# Patient Record
Sex: Female | Born: 1970 | Race: Black or African American | Hispanic: No | Smoking: Never smoker
Health system: Southern US, Community
[De-identification: ages and names within clinical notes are randomized; demographics above are authoritative.]

## PROBLEM LIST (undated history)

## (undated) DIAGNOSIS — K589 Irritable bowel syndrome without diarrhea: Secondary | ICD-10-CM

## (undated) DIAGNOSIS — J45909 Unspecified asthma, uncomplicated: Secondary | ICD-10-CM

## (undated) DIAGNOSIS — R002 Palpitations: Secondary | ICD-10-CM

## (undated) DIAGNOSIS — M2550 Pain in unspecified joint: Secondary | ICD-10-CM

## (undated) DIAGNOSIS — J302 Other seasonal allergic rhinitis: Secondary | ICD-10-CM

## (undated) DIAGNOSIS — E739 Lactose intolerance, unspecified: Secondary | ICD-10-CM

## (undated) DIAGNOSIS — M25561 Pain in right knee: Secondary | ICD-10-CM

## (undated) DIAGNOSIS — K5900 Constipation, unspecified: Secondary | ICD-10-CM

## (undated) DIAGNOSIS — E7800 Pure hypercholesterolemia, unspecified: Secondary | ICD-10-CM

## (undated) DIAGNOSIS — M549 Dorsalgia, unspecified: Secondary | ICD-10-CM

## (undated) DIAGNOSIS — I10 Essential (primary) hypertension: Secondary | ICD-10-CM

## (undated) DIAGNOSIS — Z91018 Allergy to other foods: Secondary | ICD-10-CM

## (undated) DIAGNOSIS — I341 Nonrheumatic mitral (valve) prolapse: Secondary | ICD-10-CM

## (undated) DIAGNOSIS — D649 Anemia, unspecified: Secondary | ICD-10-CM

## (undated) DIAGNOSIS — F410 Panic disorder [episodic paroxysmal anxiety] without agoraphobia: Secondary | ICD-10-CM

## (undated) DIAGNOSIS — K219 Gastro-esophageal reflux disease without esophagitis: Secondary | ICD-10-CM

## (undated) HISTORY — DX: Pure hypercholesterolemia, unspecified: E78.00

## (undated) HISTORY — DX: Pain in right knee: M25.561

## (undated) HISTORY — PX: BREAST LUMPECTOMY: SHX2

## (undated) HISTORY — PX: MYOMECTOMY: SHX85

## (undated) HISTORY — DX: Irritable bowel syndrome, unspecified: K58.9

## (undated) HISTORY — DX: Palpitations: R00.2

## (undated) HISTORY — DX: Other seasonal allergic rhinitis: J30.2

## (undated) HISTORY — DX: Gastro-esophageal reflux disease without esophagitis: K21.9

## (undated) HISTORY — DX: Unspecified asthma, uncomplicated: J45.909

## (undated) HISTORY — DX: Constipation, unspecified: K59.00

## (undated) HISTORY — DX: Nonrheumatic mitral (valve) prolapse: I34.1

## (undated) HISTORY — DX: Panic disorder (episodic paroxysmal anxiety): F41.0

## (undated) HISTORY — PX: BREAST FIBROADENOMA SURGERY: SHX580

## (undated) HISTORY — DX: Anemia, unspecified: D64.9

## (undated) HISTORY — DX: Pain in unspecified joint: M25.50

## (undated) HISTORY — DX: Lactose intolerance, unspecified: E73.9

## (undated) HISTORY — DX: Essential (primary) hypertension: I10

## (undated) HISTORY — DX: Allergy to other foods: Z91.018

## (undated) HISTORY — DX: Dorsalgia, unspecified: M54.9

---

## 1999-01-27 ENCOUNTER — Encounter: Payer: Self-pay | Admitting: Internal Medicine

## 1999-01-27 ENCOUNTER — Ambulatory Visit (HOSPITAL_COMMUNITY): Admission: RE | Admit: 1999-01-27 | Discharge: 1999-01-27 | Payer: Self-pay | Admitting: Internal Medicine

## 1999-02-09 ENCOUNTER — Encounter: Payer: Self-pay | Admitting: Internal Medicine

## 1999-02-09 ENCOUNTER — Ambulatory Visit (HOSPITAL_COMMUNITY): Admission: RE | Admit: 1999-02-09 | Discharge: 1999-02-09 | Payer: Self-pay | Admitting: Internal Medicine

## 1999-07-27 ENCOUNTER — Other Ambulatory Visit: Admission: RE | Admit: 1999-07-27 | Discharge: 1999-07-27 | Payer: Self-pay | Admitting: Internal Medicine

## 2000-03-29 ENCOUNTER — Encounter: Payer: Self-pay | Admitting: Internal Medicine

## 2000-03-29 ENCOUNTER — Encounter: Admission: RE | Admit: 2000-03-29 | Discharge: 2000-03-29 | Payer: Self-pay | Admitting: Internal Medicine

## 2000-12-27 ENCOUNTER — Encounter: Payer: Self-pay | Admitting: Obstetrics and Gynecology

## 2000-12-27 ENCOUNTER — Encounter: Admission: RE | Admit: 2000-12-27 | Discharge: 2000-12-27 | Payer: Self-pay | Admitting: Obstetrics and Gynecology

## 2001-02-03 ENCOUNTER — Other Ambulatory Visit: Admission: RE | Admit: 2001-02-03 | Discharge: 2001-02-03 | Payer: Self-pay | Admitting: Obstetrics and Gynecology

## 2001-08-14 ENCOUNTER — Encounter: Admission: RE | Admit: 2001-08-14 | Discharge: 2001-08-14 | Payer: Self-pay | Admitting: Obstetrics and Gynecology

## 2001-08-14 ENCOUNTER — Encounter: Payer: Self-pay | Admitting: Obstetrics and Gynecology

## 2001-09-18 ENCOUNTER — Encounter: Admission: RE | Admit: 2001-09-18 | Discharge: 2001-09-18 | Payer: Self-pay | Admitting: General Surgery

## 2001-09-18 ENCOUNTER — Encounter (INDEPENDENT_AMBULATORY_CARE_PROVIDER_SITE_OTHER): Payer: Self-pay | Admitting: *Deleted

## 2001-09-18 ENCOUNTER — Encounter (HOSPITAL_BASED_OUTPATIENT_CLINIC_OR_DEPARTMENT_OTHER): Payer: Self-pay | Admitting: General Surgery

## 2002-09-27 ENCOUNTER — Encounter: Payer: Self-pay | Admitting: Gynecology

## 2002-09-27 ENCOUNTER — Ambulatory Visit (HOSPITAL_COMMUNITY): Admission: RE | Admit: 2002-09-27 | Discharge: 2002-09-27 | Payer: Self-pay | Admitting: Gynecology

## 2003-05-21 ENCOUNTER — Other Ambulatory Visit: Admission: RE | Admit: 2003-05-21 | Discharge: 2003-05-21 | Payer: Self-pay | Admitting: Gynecology

## 2003-06-17 ENCOUNTER — Ambulatory Visit (HOSPITAL_COMMUNITY): Admission: RE | Admit: 2003-06-17 | Discharge: 2003-06-17 | Payer: Self-pay | Admitting: Gastroenterology

## 2003-08-29 ENCOUNTER — Inpatient Hospital Stay (HOSPITAL_COMMUNITY): Admission: RE | Admit: 2003-08-29 | Discharge: 2003-08-30 | Payer: Self-pay | Admitting: Gynecology

## 2003-08-29 ENCOUNTER — Encounter (INDEPENDENT_AMBULATORY_CARE_PROVIDER_SITE_OTHER): Payer: Self-pay | Admitting: Specialist

## 2004-05-26 ENCOUNTER — Other Ambulatory Visit: Admission: RE | Admit: 2004-05-26 | Discharge: 2004-05-26 | Payer: Self-pay | Admitting: Gynecology

## 2005-07-28 ENCOUNTER — Other Ambulatory Visit: Admission: RE | Admit: 2005-07-28 | Discharge: 2005-07-28 | Payer: Self-pay | Admitting: Gynecology

## 2007-06-11 ENCOUNTER — Ambulatory Visit (HOSPITAL_COMMUNITY): Admission: AD | Admit: 2007-06-11 | Discharge: 2007-06-11 | Payer: Self-pay | Admitting: Obstetrics and Gynecology

## 2007-06-13 ENCOUNTER — Observation Stay (HOSPITAL_COMMUNITY): Admission: AD | Admit: 2007-06-13 | Discharge: 2007-06-14 | Payer: Self-pay | Admitting: Obstetrics and Gynecology

## 2007-06-15 ENCOUNTER — Inpatient Hospital Stay (HOSPITAL_COMMUNITY): Admission: AD | Admit: 2007-06-15 | Discharge: 2007-06-17 | Payer: Self-pay | Admitting: Obstetrics and Gynecology

## 2007-06-16 ENCOUNTER — Encounter (INDEPENDENT_AMBULATORY_CARE_PROVIDER_SITE_OTHER): Payer: Self-pay | Admitting: Obstetrics and Gynecology

## 2007-10-23 ENCOUNTER — Inpatient Hospital Stay (HOSPITAL_COMMUNITY): Admission: RE | Admit: 2007-10-23 | Discharge: 2007-10-26 | Payer: Self-pay | Admitting: Obstetrics and Gynecology

## 2007-10-23 ENCOUNTER — Encounter (INDEPENDENT_AMBULATORY_CARE_PROVIDER_SITE_OTHER): Payer: Self-pay | Admitting: Obstetrics and Gynecology

## 2008-09-24 ENCOUNTER — Ambulatory Visit: Payer: Self-pay | Admitting: Advanced Practice Midwife

## 2008-09-24 ENCOUNTER — Inpatient Hospital Stay (HOSPITAL_COMMUNITY): Admission: AD | Admit: 2008-09-24 | Discharge: 2008-09-24 | Payer: Self-pay | Admitting: Obstetrics & Gynecology

## 2008-11-04 ENCOUNTER — Inpatient Hospital Stay (HOSPITAL_COMMUNITY): Admission: RE | Admit: 2008-11-04 | Discharge: 2008-11-07 | Payer: Self-pay | Admitting: Obstetrics and Gynecology

## 2008-11-04 ENCOUNTER — Encounter (INDEPENDENT_AMBULATORY_CARE_PROVIDER_SITE_OTHER): Payer: Self-pay | Admitting: Obstetrics and Gynecology

## 2009-10-02 ENCOUNTER — Encounter: Admission: RE | Admit: 2009-10-02 | Discharge: 2009-10-02 | Payer: Self-pay | Admitting: Obstetrics and Gynecology

## 2009-10-09 ENCOUNTER — Encounter: Admission: RE | Admit: 2009-10-09 | Discharge: 2009-10-09 | Payer: Self-pay | Admitting: Obstetrics and Gynecology

## 2010-06-12 ENCOUNTER — Encounter: Admission: RE | Admit: 2010-06-12 | Discharge: 2010-06-12 | Payer: Self-pay | Admitting: Obstetrics and Gynecology

## 2010-06-24 ENCOUNTER — Ambulatory Visit: Payer: Self-pay | Admitting: Cardiovascular Disease

## 2010-07-07 ENCOUNTER — Encounter
Admission: RE | Admit: 2010-07-07 | Discharge: 2010-07-07 | Payer: Self-pay | Source: Home / Self Care | Attending: Obstetrics and Gynecology | Admitting: Obstetrics and Gynecology

## 2010-10-12 ENCOUNTER — Ambulatory Visit (INDEPENDENT_AMBULATORY_CARE_PROVIDER_SITE_OTHER): Payer: Managed Care, Other (non HMO) | Admitting: Cardiovascular Disease

## 2010-10-12 ENCOUNTER — Encounter: Payer: Self-pay | Admitting: Cardiovascular Disease

## 2010-10-12 VITALS — BP 142/100 | HR 82 | Ht 62.0 in | Wt 175.2 lb

## 2010-10-12 DIAGNOSIS — I10 Essential (primary) hypertension: Secondary | ICD-10-CM | POA: Insufficient documentation

## 2010-10-12 MED ORDER — POTASSIUM CHLORIDE ER 10 MEQ PO TBCR: 10.0000 meq | EXTENDED_RELEASE_TABLET | ORAL | Status: DC

## 2010-10-12 MED ORDER — HYDROCHLOROTHIAZIDE 25 MG PO TABS
25.0000 mg | ORAL_TABLET | Freq: Every day | ORAL | Status: DC
Start: 2010-10-12 — End: 2011-02-22

## 2010-10-12 NOTE — Progress Notes (Signed)
History of Present Illness: Emmett is a young female with a history of hypertension. She feels fairly well. She's gained a little bit of weight since her last visit. She has been found that you've a low salt diet and has been trying to exercise on regular basis.  Medications:   Allergies  Allergen Reactions  . Motrin (Ibuprofen) Swelling  . Penicillins Hives  . Sulfa Antibiotics Hives    Past Medical History  Diagnosis Date  . Hypertension     History reviewed. No pertinent past surgical history.  History  Smoking status  . Never Smoker   Smokeless tobacco  . Not on file    History  Alcohol Use     Family History  Problem Relation Age of Onset  . Hypertension Mother   . Hypertension Father     Reviw of Systems:    Physical Exam: BP 142/100  Pulse 82  Ht 5\' 2"  (1.575 m)  Wt 175 lb 3.2 oz (79.47 kg)  BMI 32.04 kg/m2  LMP 09/10/2010  ECG:  Assessment / Plan:

## 2010-10-12 NOTE — Assessment & Plan Note (Signed)
Tash's blood pressure continues to be elevated. She will continue with the current dose of labetalol. We'll add HCTZ 25 mg a day as well as potassium chloride 10 mEq a day.  I'll see her back in the office in 3 months for followup office visit and BMP.

## 2010-10-20 ENCOUNTER — Telehealth: Payer: Self-pay | Admitting: Cardiovascular Disease

## 2010-10-20 NOTE — Telephone Encounter (Signed)
Returned call msg left at work and on cell to call office back. Alfonso Ramus RN

## 2010-10-20 NOTE — Telephone Encounter (Signed)
Spoke with pt, pharmacy needed clarification on escribe potassium, dr note read and i called pharmacy, script had po over 48 hours. Refer to dictation notes and clarified. Alfonso Ramus RN

## 2010-10-20 NOTE — Telephone Encounter (Signed)
CALL PT REGARDING INSTRUCTIONS FOR A MEDICATION. IF NO ANSWER AT THE CELL # CALL THE WORK #. PLACED CHART IN BOX.

## 2010-10-21 LAB — CBC
HCT: 19.3 % — ABNORMAL LOW (ref 36.0–46.0)
HCT: 20.6 % — ABNORMAL LOW (ref 36.0–46.0)
HCT: 36.5 % (ref 36.0–46.0)
Hemoglobin: 12.2 g/dL (ref 12.0–15.0)
Hemoglobin: 7 g/dL — CL (ref 12.0–15.0)
Hemoglobin: 7.6 g/dL — CL (ref 12.0–15.0)
MCHC: 34.1 g/dL (ref 30.0–36.0)
MCV: 78.7 fL (ref 78.0–100.0)
MCV: 78.9 fL (ref 78.0–100.0)
Platelets: 147 10*3/uL — ABNORMAL LOW (ref 150–400)
RBC: 2.45 MIL/uL — ABNORMAL LOW (ref 3.87–5.11)
RBC: 2.88 MIL/uL — ABNORMAL LOW (ref 3.87–5.11)
RBC: 4.66 MIL/uL (ref 3.87–5.11)
RDW: 15.3 % (ref 11.5–15.5)
RDW: 15.9 % — ABNORMAL HIGH (ref 11.5–15.5)
WBC: 18.3 10*3/uL — ABNORMAL HIGH (ref 4.0–10.5)
WBC: 19.9 10*3/uL — ABNORMAL HIGH (ref 4.0–10.5)
WBC: 8.9 10*3/uL (ref 4.0–10.5)

## 2010-10-21 LAB — TYPE AND SCREEN
ABO/RH(D): A POS
Antibody Screen: NEGATIVE

## 2010-10-21 LAB — CCBB MATERNAL DONOR DRAW

## 2010-10-22 LAB — URINALYSIS, ROUTINE W REFLEX MICROSCOPIC
Bilirubin Urine: NEGATIVE
Hgb urine dipstick: NEGATIVE
Ketones, ur: 15 mg/dL — AB
Specific Gravity, Urine: 1.01 (ref 1.005–1.030)
pH: 6 (ref 5.0–8.0)

## 2010-11-24 NOTE — Discharge Summary (Signed)
[name], [name]              ACCOUNT NO.:  [id number]   MEDICAL RECORD NO.:  [id number]          PATIENT TYPE:  INP   LOCATION:  9303                          FACILITY:  [facility]   PHYSICIAN:  Duke Salvia. Marcelle Overlie, M.D.DATE OF BIRTH:  04-01-1971   DATE OF ADMISSION:  10/23/2007  DATE OF DISCHARGE:  10/26/2007                               DISCHARGE SUMMARY   DISCHARGE DIAGNOSES:  1. History of incompetent cervix, failed McDonald cerclage.  2. A leiomyoma, previous myomectomy.  3. Multiple myomectomy in this admission with lysis of adhesions and      placement of abdominal cerclage.   Summary of the history and physical exam, please see admission H and P  for details.  Briefly, a 40 year old with a history of incompetent  cervix, previous myomectomy with recurrent large fibroids presents for  surgical correction.   HOSPITAL COURSE:  On October 23, 2007, under general anesthesia, the  patient underwent laparotomy with multiple myomectomy, 21 fibroids were  removed altogether and placement of an abdominal cerclage.  She had an  EBL of 500 mL.   On the first postoperative day, she was afebrile.  Her hemoglobin  dropped to 6.5.  It was checked later that day and had dropped to 6.1  range.  Her incision was clean and dry at that time.  She was having  minimal orthostatic changes. The IV was continued and her diet was  advanced.  On the second postop day, her repeat hemoglobin was noted to  be 5.6.  She was ambulating, but was having more difficulty with  orthostatic changes, and low-grade temp of 100.4.  She was having no  external bleeding.  No evidence of hematoma in the abdominal wall.  Decison was made to proceed with transfusion.  She received 2 units of  PRBCs on postop day #2.   On the a.m. of October 26, 2007, her hemoglobin had increased to 7.8 after  2 units of packed red blood cells and WBC of 13.8.  Her on-Q catheter  was removed.  The incision was clean and dry.  Her  abdominal exam was  unremarkable.  She remained afebrile and was ready for discharge at that  time.   LABORATORY DATA:  Blood type is A positive and antibody screen was  negative.  Preop hemoglobin of 13.6 with a WBC of 8.5 on October 19, 2007.  Postop day 1, hemoglobin 6.5.  On October 26, 2007, after 2 units of  packed red blood cells, hemoglobin 7.8, WBC 13.8 with platelet count of  127,000.  Admission routine chemistries were normal.  Pathology report  is pending.   DISPOSITION:  The patient was discharged on iron daily, Percocet p.r.n.  pain.  She will return to our office in 4 days to have the clips  removed.  Advised to return for any incisional redness or drainage,  increased pain or bleeding, or fever over 101.  She was given specific  instructions regarding diet, sex, and exercise.   CONDITION:  Good.   ACTIVITY:  Good and increase.      Richard M. Marcelle Overlie, M.D.  Electronically Signed     RM[facility]/MEDQ  D:  10/26/2007  T:  10/26/2007  Job:  [id number]

## 2010-11-24 NOTE — H&P (Signed)
[name], [name]              ACCOUNT NO.:  [id number]   MEDICAL RECORD NO.:  [id number]        PATIENT TYPE:  WINP   LOCATION:                                FACILITY:  [facility]   PHYSICIAN:  Duke Salvia. Marcelle Overlie, M.D.DATE OF BIRTH:  Dec 06, 1970   DATE OF ADMISSION:  11/04/2008  DATE OF DISCHARGE:                              HISTORY & PHYSICAL   DATE OF SURGERY:  November 04, 2008   CHIEF COMPLAINT:  A 40 year old G3, P 0-0-2-0, EDD Nov 09, 2008.  Pregnancy has been complicated by prior history of multiple myomectomy  with placement of an abdominal cervical cerclage at the same time in  April 2009 secondary to a history of incompetent cervix and pregnancy  loss at 18 weeks.  She also has a history of chronic hypertension.  Initially during the pregnancy was on low dose labetalol, which we had  stop due to orthostatic changes and has maintained normal blood  pressures off of her medications.  She has had weekly NST or BPP that  have been normal and presents for primary cesarean section due to  history of prior multiple myomectomy.  With her prior pregnancy loss she  had a hypercoagulable profile that was normal negative for LA, factor V  Leiden etc.   Blood type is A+, GBS was negative.  1-hour GTT was 94.   PAST MEDICAL HISTORY:   ALLERGIES:  SULFA, PENICILLIN, and MOTRIN   OBSTETRICAL HISTORY:  TAB in 1999 missed AB at 18 weeks 2008.   REVIEW OF SYSTEMS:  Significant for hypertension, history of prior  multiple myomectomy with abdominal cerclage.   Please see hospital form for family and social history.   PHYSICAL EXAMINATION:  VITAL SIGNS:  Temperature 92, blood pressure  120/84.  HEENT:  Unremarkable.  NECK:  Supple without masses.  LUNGS:  Clear.  CARDIOVASCULAR:  Rate and rhythm without murmurs, rubs, or gallops.  BREASTS:  Without masses.  Term fundal height.  Fetal heart rate 140.  Cervix was closed.  EXTREMITIES:  Unremarkable.  NEUROLOGIC:  Unremarkable.   IMPRESSION:  1. Term intrauterine pregnancy.  2. History of chronic hypertension, not currently on medication.  3. History of multiple myomectomy with incompetent cervix.  Prior      placement of abdominal cerclage.   PLAN:  Primary cesarean section.  Risks related to bleeding, infection,  transfusion, and adjacent organ injury all reviewed with her, which she  understands and accepts.  She prefers to keep the cerclage in place for  possible future pregnancy.      Richard M. Marcelle Overlie, M.D.  Electronically Signed     RMH/MEDQ  D:  10/30/2008  T:  10/30/2008  Job:  [id number]

## 2010-11-24 NOTE — Op Note (Signed)
NAMEJAMECIA, [name]              ACCOUNT NO.:  [id number]   MEDICAL RECORD NO.:  [id number]          PATIENT TYPE:  INP   LOCATION:  9101                          FACILITY:  [facility]   PHYSICIAN:  [name], M.D.DATE OF BIRTH:  09-19-1970   DATE OF PROCEDURE:  DATE OF DISCHARGE:                               OPERATIVE REPORT   PREOPERATIVE DIAGNOSES:  1. Term intrauterine pregnancy.  2. Incompetent cervix.  3. Previous multiple myomectomy.  4. Abdominal cerclage.  5. Chronic hypertension.   POSTOPERATIVE DIAGNOSES:  1. Term intrauterine pregnancy.  2. Incompetent cervix.  3. Previous multiple myomectomy.  4. Abdominal cerclage.  5. Chronic hypertension.  6. Breech presentation.   PROCEDURE:  Primary low transverse cesarean section, lysis of adhesions.   SURGEON:  Karen Salvia. Marcelle Overlie, MD   ASSISTANT:  Karen Cairo, MD   ANESTHESIA:  Spinal.   COMPLICATIONS:  None.   DRAINS:  Foley catheter.   BLOOD LOSS:  900 mL.   PROCEDURE AND FINDINGS:  The patient was taken to the operative room.  After adequate level of spinal anesthetic was obtained with the patient  in left tilt position, the abdomen prepped and draped in the usual  manner for sterile abdominal procedure.  Foley catheter positioned  draining clear urine.  Transverse incision was made excising the old  scar which was carried down to the fascia.  Moderate amount of scarring  was noted.  The fascia was separated transversely undermined from the  rectus muscles which were divided in the midline.  Peritoneum entered  superiorly without incident and extended vertically.  There were some  minimal filmy omental adhesions which were lysed but access to the  anterior uterus was good.  Bladder blade was positioned.  Vesicouterine  serosa was incised, and the bladder was bluntly and sharply dissected  below.  The abdominal cerclage knot could be palpated well below.  Transverse incision was made, extended with  bandage scissors.  Prior to  incision, it was noted to be non-vertex.  A unstable breech presentation  was noted.  The first body part that delivered was actually hand, this  was reduced, one foot was then delivered followed by the other with full  breech extraction of a female, Apgars 7 and 9, pH 7.05.  The infant was  suctioned, cord clamped, and passed to the pediatric team for further  care.  The placenta was delivered spontaneously intact.  The uterus  could not be exteriorized.  There were some minimal small fibroids  noted, two of which in the incision line were excised and sent to  Pathology.  Tubes and ovaries were normal.  Incision was closed with a  running 0 chromic suture in locked fashion followed by an imbricating  layer of 0 chromic.  Several interrupted sutures were required for  complete hemostasis.  Once this was hemostatic, it was irrigated and  aspirated, noted to be dry.  The bladder flap area was intact and  hemostatic, and the abdominal cerclage knot was palpated below at the  isthmus.  Prior to closure, sponge, needle, and instrument counts were  reported as correct x2.  Peritoneum closed with a running 2-0 Vicryl  suture.  Fascia was closed from laterally to midline on either side with  a 0 PDS suture.  The subcutaneous tissue was undermined to allow for  better closure and less tension.  This was irrigated and made hemostatic  with the Bovie.  Skin closed with a subcuticular 4-0 Monocryl suture and  a pressure dressing.  She tolerated this well, went to recovery room in  good condition.      [name], M.D.  Electronically Signed     RMH/MEDQ  D:  11/04/2008  T:  11/04/2008  Job:  [id number]

## 2010-11-24 NOTE — Op Note (Signed)
[name], [name]              ACCOUNT NO.:  [id number]   MEDICAL RECORD NO.:  [id number]          PATIENT TYPE:  INP   LOCATION:  9303                          [facility]ACILITY:  [facility]   PHYSICIAN:  Duke Salvia. Marcelle Overlie, M.D.DATE OF BIRTH:  Aug 13, 1970   DATE OF PROCEDURE:  10/23/2007  DATE OF DISCHARGE:                               OPERATIVE REPORT   PREOPERATIVE DIAGNOSES:  1. Incompetent cervix.  2. Previous myomectomy.  3. Leiomyoma.   POSTOPERATIVE DIAGNOSES:  1. Incompetent cervix.  2. Previous myomectomy.  3. Leiomyoma.  4. Pelvic endometriosis and pelvic adhesions.   PROCEDURES:  1. Laparotomy with lysis of adhesions.  2. Right ovarian cystectomy for endometrioma.  3. Multiple myomectomies.  4. abdominal cerclage   SURGEON:  Duke Salvia. Marcelle Overlie, M.D.   ASSISTANT:  Dineen Kid. Rana Snare, M.D.   ANESTHESIA:  General.   COMPLICATIONS:  None.   DRAINS:  Foley catheter.   BLOOD LOSS:  500 mL.   COMPLICATIONS:  None.   SPECIMENS REMOVED:  Leiomyoma.   PROCEDURE AND FINDINGS:  The patient was taken to the operating room.  After an adequate level of general anesthesia was obtained with the  patient in the supine position, the abdomen prepped and draped in usual  manner for sterile abdominal procedures.  Foley catheter was positioned  draining clear urine.  A transverse incision was made through the old  scar, carried down to the fascia, which was incised and extended  transversely.  Rectus muscles were divided in the midline.  Peritoneum  entered superiorly without incident and extended vertically.  There were  some posterior omental adhesions that were lysed in avascular plane, and  several in the bladder flap area that were lysed in an avascular plane,  allowing the uterus to be presented through the incision; approximately  14-15 weeks size, with multiple leiomyoma.  Dilute Pitressin 1 ampule  and 200 mL was then used to inject into the area of incision of the  fibroids  anteriorly and posteriorly.  Multiple myomectomy was carried  out by sharp and blunt dissection; 21 fibroids in all were removed.  Tubes and ovaries looked normal, except for the right endometrioma.   After this was completed, the deeper areas were brought together, with  interrupted 2-0 Monocryl suture and 2-0 Monocryl running on the serosa,  for complete hemostasis.  The right ovarian cystectomy was carried out  revealing old chocolate material within the ovary.  This was irrigated  out thoroughly.  The cyst wall was stripped out and hemostasis with 2-0  Monocryl sutures to reapproximate the ovary, with good hemostasis.  The  left ovary was unremarkable.  Irrigation was carried out at that point.  The area of myomectomies were unremarkable, hemostatic and no further  fibroids were palpable.  The bladder flap was then carried down a short  distance.  A Babcock clamp was then used to gently retract uterine  vessels laterally on the right.  A 5 mm Mersilene was placed at the  cervical isthmus, medial to the vessels on the right; brought out  posteriorly and back through the opposite side,  medial to the vessels on  the left.  Then the abdominal cerclage was tied down snugly anteriorly.  The bladder flap area was then reapproximated with the 2-0 Vicryl suture  to cover the knot; this area was hemostatic.  The area of surgery was  inspected along the uterus, and noted to be hemostatic.  Prior to  closure, sponge, instrument and needle and counts were reported as  correct x2.  Intercede was then used to cover the right ovarian  cystectomy and also the myomectomy sites.  The perineum was then closed  with a running 2-0 Vicryl suture; 2-0 Vicryl interrupted sutures were  used to approximate the muscles.  Then 0 PDS used from laterally to  midline on either side to close the fascia; this area was hemostatic.  A  subfascial and subcutaneous On-Q catheter were positioned prior to  closure.  A  pressure dressing used.  She tolerated this well and went to  the recovery room in good condition.  Clear urine noted at the end of  the case.      Richard M. Marcelle Overlie, M.D.  Electronically Signed     RMH/MEDQ  D:  10/23/2007  T:  10/23/2007  Job:  [id number]

## 2010-11-24 NOTE — Op Note (Signed)
NAMEKEYLAH, [name]              ACCOUNT NO.:  [id number]   MEDICAL RECORD NO.:  [id number]          PATIENT TYPE:  MAT   LOCATION:  MATC                          [facility]ACILITY:  [facility]   PHYSICIAN:  Duke Salvia. Marcelle Overlie, M.D.DATE OF BIRTH:  1971-01-26   DATE OF PROCEDURE:  DATE OF DISCHARGE:                               OPERATIVE REPORT   PREOPERATIVE DIAGNOSIS:  1. 17-week IUP.  2. Prior myomectomy with multiple fibroids.  3. Significant funneling on recent ultrasound exam, possible      incompetent cervix.   POSTOPERATIVE DIAGNOSIS:  1. 17-week IUP.  2. Prior myomectomy with multiple fibroids.  3. Significant funneling on recent ultrasound exam, possible      incompetent cervix.   PROCEDURE:  McDonald cerclage.   SURGEON:  Richarda Overlie, M.D.   ANESTHESIA:  Spinal.   COMPLICATIONS:  None.   DRAINS:  In-and-out catheter.   BLOOD LOSS:  Minimal.   PROCEDURE AND FINDINGS:  The patient was taken to the operating room and  after an adequate level of spinal anesthetic was obtained with the  patient's legs in stirrups the perineum and vagina was prepped and  draped with Betadine.  The bladder was emptied.  A weighted speculum was  positioned.  The cervix was surveyed and the ectocervix appeared to be  normal with a closed cervix.  A small ring forcep was then used to grasp  the anterior cervical lip.  The cervical vaginal boundary was demarcated  and #2 Tevdek was started anteriorly placed in a pursestring fashion  around the cervix ending at the top and tied down.  There was minimal  bleeding.  The bladder was recatheterized with clear urine noted.  She  tolerated this well and went to the PACU in good condition.      Richard M. Marcelle Overlie, M.D.  Electronically Signed     RMH/MEDQ  D:  06/11/2007  T:  06/11/2007  Job:  [id number]

## 2010-11-24 NOTE — H&P (Signed)
[name], [name]              ACCOUNT NO.:  [id number]   MEDICAL RECORD NO.:  [id number]          PATIENT TYPE:  AMB   LOCATION:  SDC                           [facility]ILITY:  [facility]   PHYSICIAN:  Duke Salvia. Marcelle Overlie, M.D.DATE OF BIRTH:  Aug 22, 1970   DATE OF ADMISSION:  DATE OF DISCHARGE:                              HISTORY & PHYSICAL   CHIEF COMPLAINT:  Symptomatic leiomyoma, incompetent cervix.   HISTORY OF PRESENT ILLNESS:  A 40 year old G1, P0.  I saw this patient  originally in 2007.  She had 2 years earlier had a multiple myomectomy  by Dr. Teodora Medici and initially presented as a consult for  confirmation of a recommendation for hysterectomy.  After that visit,  because she had subsequently married, she was more inclined to consider  pregnancy and did get pregnant, although followup ultrasound had shown  multiple recurrent new fibroids.  Initially, did well early in the  pregnancy.  By November of 2008, she had ultrasound changes that showed  a dynamic cervix.  She was admitted for McDonald cerclage which was  carried out as an outpatient.   Unfortunately, she developed after that some increasing contractions.  When she was admitted, she had a cervical length of 1.2.  She was given  IV antibiotics, but she had significant labor.  The suture was removed  and she delivered a previable female infant.   More recently, ultrasound showed multiple fibroids as large as 5-6 cm,  and after further discussion, she does want to pursue pregnancy again  and is admitted now for repeat myomectomy and abdominal cerclage.  This  procedure was discussed in detail including risks related to bleeding,  infection, transfusion, adjacent organ injury, the need for delivery by  a cesarean.  Other risks related to bladder or bowel injury, expected  recovery time, wound infection, phlebitis, the possibility of  hysterectomy for emergent bleeding issues, etc. all discussed which she  understands and  accepts.   ALLERGIES:  MOTRIN, SULFA, PENICILLIN, DUST, MOLD.   CURRENT MEDICATIONS:  Potassium, labetalol, HCTZ, Mucinex p.r.n.,  Benadryl OTC p.r.n.   PAST SURGICAL HISTORY:  1. Myomectomy.  2. Lumpectomy.  3. Miscarriage in December of 2008.   FAMILY HISTORY:  Significant for hypertension and arthritis.  Both her  mother and father have hypertension.  Her mother has had an angioplasty  for coronary artery disease.   PHYSICAL EXAMINATION:  VITAL SIGNS:  Temperature 98.2, blood pressure  110/60.  HEENT:  Unremarkable.  NECK:  Supple without masses.  LUNGS:  Clear.  CARDIOVASCULAR:  Regular rate and rhythm without murmurs, rubs, gallops.  BREASTS:  Without masses.  ABDOMEN:  Soft, flat, nontender.  PELVIC:  Fibroids are palpable above the symphysis.  Vulva, vagina,  cervix normal.  Uterus 15-week size.  EXTREMITIES:  Unremarkable.  NEUROLOGIC:  Unremarkable.   IMPRESSION:  1. Symptomatic leiomyoma.  2. Prior myomectomy.  3. Incompetent cervix, failed McDonald cerclage.   PLAN:  Myomectomy, abdominal cerclage.  Procedure and risks reviewed as  above.      Richard M. Marcelle Overlie, M.D.  Electronically Signed  RMH/MEDQ  D:  10/17/2007  T:  10/17/2007  Job:  [id number]

## 2010-11-24 NOTE — H&P (Signed)
[name], [name]              ACCOUNT NO.:  [id number]   MEDICAL RECORD NO.:  [id number]           PATIENT TYPE:   LOCATION:                                [facility]ILITY:  [facility]   PHYSICIAN:  Duke Salvia. Marcelle Overlie, M.D.    DATE OF BIRTH:   DATE OF ADMISSION:  DATE OF DISCHARGE:                              HISTORY & PHYSICAL   CHIEF COMPLAINT:  Pregnancy, possible incompetent cervix.   HISTORY OF PRESENT ILLNESS:  A 40 year old G2, P0-0-1-0, EDD of May 13  at 28 to 18 weeks. This patient's prenatal course has been complicated  by history of prior myomectomy, chronic hypertension currently on  labetalol and multiple fibroids. On routine ultrasound screening in the  office today, the fetus had normal anatomy, but she was noted to have a  dynamic cervix that showed significant funneling. Her only prior  cervical history is a prior history of cryo. Due to these findings,  recommendation made for McDonald cerclage. This procedure including  risks of bleeding, infection, rupture of membranes, miscarriage along  with other risks associated with premature labor and incompetent cervix  reviewed with the patient and her husband. They understand and accept  the recommendation.   PAST MEDICAL HISTORY:   CURRENT MEDICATIONS:  Prenatal vitamins and labetalol 300 mg p.o. b.i.d.   First trimester screen and AFP only returned normal.   OBSTETRICAL HISTORY:  TAB in 1999.   FAMILY HISTORY:  Is significant for a mother and father with chronic  hypertension. The patient has asymptomatic mitral valve prolapse. Mother  had a history of phlebitis. The patient's hypercoagulable profile work  up was negative.   PRIOR SURGERY:  Myomectomy.   Also noted a history of mild asthma and treated trich and chlamydia in  the past.   PHYSICAL EXAMINATION:  Temperature 98.2, blood pressure 130/84.  HEENT:  Unremarkable.  NECK:  Supple without masses.  LUNGS:  Clear.  CARDIOVASCULAR:  Regular rate and rhythm  without murmurs, rubs, or  gallops.  BREASTS:  Not examined.  The fundal height was 25. Fetal heart 140. Cervix:  External os was  closed.  EXTREMITIES AND NEUROLOGIC EXAM:  Unremarkable.   IMPRESSION:  1. A 17-week intrauterine pregnancy.  2. Chronic hypertension on labetalol.  3. Prior myomectomy.  4. Multiple fibroids.  5. Dynamic cervix with funneling and significant thinning.   PLAN:  Outpatient McDonald cerclage. Procedure and risks reviewed as  above.      Richard M. Marcelle Overlie, M.D.  Electronically Signed     RMH/MEDQ  D:  06/07/2007  T:  06/07/2007  Job:  [id number]

## 2010-11-27 NOTE — Discharge Summary (Signed)
[name], [name]              ACCOUNT NO.:  [id number]   MEDICAL RECORD NO.:  [id number]          PATIENT TYPE:  INP   LOCATION:  9101                          FACILITY:  [facility]   PHYSICIAN:  Duke Salvia. Marcelle Overlie, M.D.DATE OF BIRTH:  11-06-1970   DATE OF ADMISSION:  11/04/2008  DATE OF DISCHARGE:  11/07/2008                               DISCHARGE SUMMARY   ADMITTING DIAGNOSES:  1. Intrauterine pregnancy at term.  2. History of chronic hypertension, not currently on medication.  3. History of multiple myomectomy with incompetent cervix with prior      placement of abdominal cerclage.  4. Breech presentation.   DISCHARGE DIAGNOSES:  1. Status post low transverse cesarean section.  2. Viable female infant.   REASON FOR ADMISSION:  Please see dictated H and P.   HOSPITAL COURSE:  The patient is a 40 year old, gravida 3, para 0-0-2-0,  that was admitted to [facility] for scheduled cesarean  section.  The patient's pregnancy had been complicated by a prior  history of multiple myomectomy with a placement of abdominal cerclage at  the same time of 2009 secondary to a history of an incompetent cervix  and pregnancy loss at 18 weeks.  The patient also had a history of  chronic hypertension.  Initially during her pregnancy, she was placed on  a low-dose labetalol which had been stopped due to orthostatic changes,  and she was able to maintain her blood pressure normally off her  medication.  The patient had weekly nonstress tests, biophysical profile  which had been normal, and she presents today for primary C-section.  On  admission, vital signs were stable.  The patient was reexamined and also  found to have a breech presentation.  She was transferred to the  operating room where spinal anesthesia was administered without  difficulty.  A low transverse incision was made with delivery of a  viable female infant with Apgars of 7 at 1 minute and 9 at 5 minutes.  Arterial  cord pH was 7.05.  The patient tolerated the procedure well and  was taken to the recovery room in stable condition.  On postoperative  day 1, the patient was without complaint.  Vital signs were stable.  She  was afebrile.  Heart rate was 106-118.  Abdomen soft with good return of  bowel function.  Fundus was firm and nontender.  Abdominal dressing was  noted to be clean, dry, and intact.  Foley had been discontinued at the  time of rounding; however, she had not voided.  Laboratory findings  revealed hemoglobin of 7.6, platelet count of 161,000, WBC count of  19.0.  CBC was ordered for the following morning.  On postoperative day  2, the patient was ambulating with an occasional dizziness.  Vital signs  were stable.  Abdomen was soft.  Abdominal dressing had been removed  revealing an incision that was clean, dry, and intact.  Laboratory  findings revealed hemoglobin of 6.5.  CBC was ordered for the afternoon.  On postoperative day 3, the patient denied any headache or blurred  vision or right upper  quadrant pain.  Vital signs were stable.  Blood  pressure 137/86 to 135/85.  Deep tendon reflexes are 1+.  No clonus.  No  pedal edema observed.  Abdomen was slightly distended.  She had good  results after the suppository.  No right upper quadrant tenderness was  noted.  Fundus firm and nontender.  Incision was clean, dry, and intact  with subcuticular closure.  Laboratory findings revealed hemoglobin of  7.0, platelet count of 165.  Discharge instructions were reviewed, and  the patient was later discharged home.   CONDITION ON DISCHARGE:  Stable.   DIET:  Regular as tolerated.   ACTIVITY:  No heavy lifting.  No driving x2 weeks.  No vaginal entry.   FOLLOWUP:  The patient to follow up in the office in 3-5 days for an  incision check and blood pressure check.  She is to call for temperature  greater than 100 degrees, persistent nausea, vomiting, heavy vaginal  bleeding, and/or redness  or drainage from the incisional site.  The  patient was also instructed to call for headache or blurred vision or  right upper quadrant pain.   DISCHARGE MEDICATIONS:  1. Tylox #30 one p.o. every 4-6 hours p.r.n.  2. Motrin 600 mg every 6 hours.  3. Prenatal vitamins 1 p.o. daily.  4. Colace 1 p.o. daily p.r.n.      Julio Sicks, N.P.      Richard M. Marcelle Overlie, M.D.  Electronically Signed    CC/MEDQ  D:  11/21/2008  T:  11/21/2008  Job:  [id number]

## 2010-11-27 NOTE — H&P (Signed)
NAME:  [name], [name]                           ACCOUNT NO.:  [id number]   MEDICAL RECORD NO.:  [id number]                   PATIENT TYPE:  INP   LOCATION:  NA                                   [facility]ACILITY:  [facility]   PHYSICIAN:  Howard C. Mezer, M.D.               DATE OF BIRTH:  April 27, 1971   DATE OF ADMISSION:  DATE OF DISCHARGE:                                HISTORY & PHYSICAL   ADMITTING DIAGNOSIS:  Fibroid uterus.   HISTORY OF PRESENT ILLNESS:  The patient is a 40 year old gravida 1, AB 1  female admitted with fibroid uterus for myomectomy.  The patient's fibroid  growth has been increasing over the last several years and while on birth  control pills symptoms were minimal, but the patient had stopped oral  contraceptives secondary to hypertension; and, since that time has noted a  significant increase in her flow and has right-sided pelvic pain not related  to her menstrual cycle.  The patient also has increased pelvic pressure,  dysmenorrhea and some urge incontinence.  Options have been thoroughly  discussed on several occasions with the patient and options including  further observation, uterine fibroid embolization, hysterectomy and  myomectomy have been reviewed in detail.  The patient has chosen to proceed  with myomectomy at this time as she wishes to preserve her fertility.  She  understands it may not be possible to remove all the fibroids because some  may be in a location that would significantly increase her risk for a  hysterectomy at this time.  The patient strongly wishes to leave fibroids in  situ that would increase her risk for hysterectomy at this time.  The  patient also fully understands that it may not be possible to make this  assessment perfectly and that there is a possibility that she will need to  undergo a hysterectomy at this surgery if there is excessive bleeding or we  are unable to reconstruct the uterus.  The patient accepts this risk.  Myomectomy  has been discussed in detail and potential complications  including, but not limited to, injury to the bowel, bladder, ureters,  possible blood loss with transfusion and its sequelae, and possible  infection in the pelvis or in the wound.  The patient understands that if  she does become pregnant that she may need a cesarean section, and there is  a possibility of a uterine rupture with the pregnancy, possible  postoperative adhesions secondary to the myomectomy and the consequences of  these adhesions have been discussed.  The patient understands there is no  guarantee to relieve her menstrual symptoms, her pelvic pain or pressure, or  to become pregnant postoperatively.  Postoperative expectations and  restrictions have been reviewed in detail, and pain control has been  discussed.  All the patient's questions have been answered and she appears  to have a realistic understanding of the procedure,  its potential risks and  alternatives.   PAST SURGICAL HISTORY:  1. Needle biopsy of the breast.  2. D&E.   PAST MEDICAL HISTORY:  1. Hypertension.  2. Headaches.  3. Irritable bowel syndrome.  4. Asthma.   MEDICATIONS:  Norvasc, Allegra D and no herbs or supplements.   ALLERGIES:  Allergies to PENICILLIN, SULFA and MOTRIN.   FAMILY HISTORY:  Family history is positive for diabetes, cardiovascular  disease, breast cancer, and fibroids.   SOCIAL HISTORY:  The patient is employed with a busy travel schedule.  Smokes; none.  Ethanol; occasional.   PHYSICAL EXAMINATION:  HEENT:  Physical examination reveals the HEENT to be  negative.  LUNGS:  Lungs are clear.  HEART:  The heart is without murmurs.  BREASTS:  The breast are without masses or discharge.  ABDOMEN:  The abdomen is soft and nontender with fibroids approximately to  the umbilicus.  PELVIC:  The pelvic exam reveals the BUS, vagina and cervix to be normal.  The uterus is 18-20 weeks in size, mostly central fibroids.  The  adnexa are  without palpable masses.  EXTREMITIES:  The extremities are negative.   IMPRESSION:  1. Fibroid uterus.  2. Pelvic pain.  3. Menorrhagia.  4. Dysmenorrhea.  5. Urinary incontinence.  6. Hypertension.  7. Asthma.   PLAN:  Exploratory laparotomy with myomectomy.                                               Leatha Gilding. Mezer, M.D.    HCM/MEDQ  D:  08/29/2003  T:  08/29/2003  Job:  [id number]   cc:   Merlene Laughter. Renae Gloss, M.D.  [address]  [address]  [place]  [place] [address]  Fax: [phone]

## 2010-11-27 NOTE — Op Note (Signed)
NAME:  [name], [name]                           ACCOUNT NO.:  [id number]   MEDICAL RECORD NO.:  [id number]                   PATIENT TYPE:  INP   LOCATION:  0009                                 FACILITY:  [facility]   PHYSICIAN:  Leatha Gilding. Mezer, M.D.               DATE OF BIRTH:  Nov 12, 1970   DATE OF PROCEDURE:  08/29/2003  DATE OF DISCHARGE:                                 OPERATIVE REPORT   PREOPERATIVE DIAGNOSIS:  Fibroid uterus.   POSTOPERATIVE DIAGNOSES:  1. Fibroid uterus.  2. Endometriosis of the left ovary.   PROCEDURES PERFORMED:  1. Multiple myomectomy.  2. Fulguration of endometriosis.   SURGEON:  Leatha Gilding. Mezer, M.D.   ASSISTANT:  Almedia Balls. [name], M.D.   ANESTHESIA:  General endotracheal.   PREPARATION:  With the patient in the supine position, she was prepped and  draped in the routine fashion.  A Pfannenstiel incision was made through the  skin and subcutaneous tissue.  The fascia and peritoneum were opened without  difficulty.  Brief exploration of the upper abdomen was benign.  Exploration  of the pelvis revealed the uterus to be approximately 20 weeks in size with  multiple large fibroids emanating from the anterior and posterior surface of  the uterus with the largest fibroid emanating from the left cornual area,  elevating the ovary and utero-ovarian vessels.  The fibroids were approached  from anterior to posterior and were taken down systematically by injecting  the serosa with a dilute solution of Pitressin, incising the serosal surface  into the myometrium, isolating the fibroids, and very carefully dissecting  fibroids free of the myometrium, taking care to avoid the endometrial  cavity.  There was no entry into the endometrial cavity from any of the  myomectomies, although the incisions were quite deep both anteriorly and  posteriorly.  Whenever possible, multiple fibroids were removed through the  same incision, but this required four separate incisions  to remove the  fibroids.  One sizeable fibroid was left, as it was very posterior and  lateral on the right side and would have jeopardized the uterus more than  the patient was willing to tolerate.  The most difficult fibroid to remove  was the left cornual fibroid.  The incision was made vertically on the  fibroid on the opposite side from the ovary, and the fibroid was very slowly  and carefully separated from the uterus and the ovarian vessels and the  fallopian tube appeared not to be harmed.  All of the myomectomy sites were  closed in layers using a running locked 0 chromic suture in multiple layers  and the serosa closed with a baseball stitch of 3-0 PDS.  Both fallopian  tubes appear to be normal.  There were multiple implants of endometriosis on  the left ovary, and these were fulgurated.  The myomectomy sites were very  carefully observed, and there appeared  to be absolutely no bleeding from any  of the suture lines.  The pelvis was irrigated with copious amounts of warm  lactated Ringer's solution and with hemostasis intact, an effort was made to  place the large bowel in the cul-de-sac.  The omentum was brought down and  the abdomen was closed in layers using a running 2-0 Vicryl on the  peritoneum, running 0 Vicryl to the midline bilaterally on the fascia,  hemostasis was assured in the subcutaneous tissue, and the  skin was closed with staples.  The estimated blood loss was approximately  350 mL.  The sponge, instrument, and needle counts were correct x2.  The  patient tolerated the procedure well and was taken to the recovery room in  satisfactory condition.                                               Leatha Gilding. Mezer, M.D.    HCM/MEDQ  D:  08/29/2003  T:  08/29/2003  Job:  [id number]   cc:   Merlene Laughter. Renae Gloss, M.D.  [address]  [address]  [place]  [place] [address]  Fax: [phone]

## 2010-11-27 NOTE — Op Note (Signed)
NAME:  [name], [name]                           ACCOUNT NO.:  [id number]   MEDICAL RECORD NO.:  [id number]                   PATIENT TYPE:  AMB   LOCATION:  ENDO                                 FACILITY:  [facility]   PHYSICIAN:  Anselmo Rod, M.D.               DATE OF BIRTH:  01-01-1971   DATE OF PROCEDURE:  06/17/2003  DATE OF DISCHARGE:                                 OPERATIVE REPORT   PROCEDURE PERFORMED:  Colonoscopy.   ENDOSCOPIST:  Charna Elizabeth, M.D.   INSTRUMENT USED:  Olympus video colonoscope.   INDICATIONS FOR PROCEDURE:  Rectal bleeding with intermittent diarrhea and  occasional constipation in a 40 year old African-American female.  Rule out  diabetes, hemorrhoids, arteriovenous malformations, etc.   PREPROCEDURE PREPARATION:  Informed consent was procured from the patient.  The patient was fasted for eight hours prior to the procedure and prepped  with a bottle of magnesium citrate and a gallon of GoLYTELY the night prior  to the procedure.   PREPROCEDURE PHYSICAL:  The patient had stable vital signs.  Neck supple.  Chest clear to auscultation.  S1 and S2 regular.  Abdomen soft with normal  bowel sounds.   DESCRIPTION OF PROCEDURE:  The patient was placed in left lateral decubitus  position and sedated with 70 mg of Demerol and 9 mg of Versed in slow  incremental doses.  Once the patient was adequately sedated and maintained  on low flow oxygen and continuous cardiac monitoring, the Olympus video  colonoscope was advanced from the rectum to the cecum and terminal ileum  without difficulty.  The patient had an excellent prep.  No masses, polyps,  erosions, ulcerations or diverticula were seen.  The appendicular orifice  and ileocecal valve were clearly visualized and photographed.  The terminal  ileum appeared healthy and without evidence of Crohn's.  Retroflexion in the  rectum revealed no abnormalities.   IMPRESSION:  Normal colonoscopy up to the terminal ileum.   No masses, polyps  or inflammatory bowel disease noted.   RECOMMENDATIONS:  1. Continue high fiber diet with liberal fluid intake.  2. Outpatient followup in the next four weeks for further recommendations.                                               Anselmo Rod, M.D.    JNM/MEDQ  D:  06/17/2003  T:  06/17/2003  Job:  [id number]   cc:   Merlene Laughter. Renae Gloss, M.D.  [address]  [address]  [place]  [place] [address]  Fax: [phone]

## 2010-11-27 NOTE — Discharge Summary (Signed)
[name], [name]              ACCOUNT NO.:  [id number]   MEDICAL RECORD NO.:  [id number]          PATIENT TYPE:  INP   LOCATION:  9309                          FACILITY:  [facility]   PHYSICIAN:  Michelle L. Grewal, M.D.DATE OF BIRTH:  1971-04-04   DATE OF ADMISSION:  06/15/2007  DATE OF DISCHARGE:  06/17/2007                               DISCHARGE SUMMARY   ADMITTING DIAGNOSES:  1. Intrauterine pregnancy at 17-5/7 weeks, estimated gestational age      two.  2. History of myomectomy secondary to multiple fibroids.  3. Cervical cerclage for onset of labor.  4. Chronic hypertension.   DISCHARGE DIAGNOSIS:  Status post vaginal delivery to nonviable female  fetus with gastroschisis.   PROCEDURE:  Spontaneous vaginal delivery.   REASON FOR ADMISSION:  Please see written history and physical.   HOSPITAL COURSE:  The patient was a 40 year old gravida 2, para 0 that  was admitted to [facility] at 17-5/7 weeks with  complaints of increasing contractions.  The patient did have a history  of myomectomy and previous placed cervical cerclage for a shortened  cervix.  The patient also had known chronic hypertension on labetalol.  On admission, contractions were noted to be every five to eight minutes.  The patient was given terbutaline and IV fluids without significant  response from medication.  The patient had known cervical cerclage and  was being administered progesterone weekly IM injections.  On admission,  ultrasound was performed which revealed cervical length of 1.2 cm with  multiple fibroids measuring each 5 to 6 cm.  The baby was noted to be in  the breech presentation with normal amniotic fluid index.  Blood  pressure was 152/94, pulse 89, respirations 20, temperature 97.8.  Laboratory findings were all within normal limits.  Fetal heart tones  were in the 150.  Contractions were approximately five to eight minutes.  The patient was now admitted for tocolysis of her  labor.  During the  evening, the patient had gross rupture of membranes.  The patient was  counseled regarding management of her labor and possible development of  chorioamnionitis and delivery despite attempts to stop her labor.  The  patient was given IV antibiotics, and maternal fetal medicine consult  was made.  Within the hour, no fetal heart tones were auscultated, and  Dr. Rana Snare was summoned to the hospital at which time ultrasound was  performed which had revealed no fetal heart tones, consistent with fetal  demise.  The patient elected an induction.  Exam revealed cervix that  was 1 cm dilated.  Stitch was removed which now revealed a prolapsed  cord.  Epidural was placed for her comfort, and Cytotec was administered  for an induction of labor.  Chaplain was in to visit with the patient  and the family.  Later that evening, the patient did deliver  spontaneously a breech female infant.  The placenta remained intact, and  Cytotec was now re-administered.  Approximately three hours later, the  patient did deliver intact placenta which was sent to pathology.  The  patient did not experience any  further bleeding.  Epidural was  discontinued, and the patient was then transferred to the mother baby  unit.  On the following morning, the patient was without complaint.  Vital signs were stable.  Fundus was firm and nontender.  Discharge  instructions were reviewed, and the patient was later discharged home.   CONDITION ON DISCHARGE:  Stable.   DIET:  Regular as tolerated.   ACTIVITY:  No vaginal entry.   DISCHARGE MEDICATIONS:  Prescriptions were given for Tylox #30, 1 p.o.  every 4 to 6 hours, Motrin 600 mg every 6 hours, prenatal vitamins.      [name], N.P.      Stann Mainland. Vincente Poli, M.D.  Electronically Signed    CC/MEDQ  D:  07/28/2007  T:  07/28/2007  Job:  [id number]

## 2010-12-28 ENCOUNTER — Other Ambulatory Visit: Payer: Self-pay | Admitting: Obstetrics and Gynecology

## 2010-12-28 DIAGNOSIS — N649 Disorder of breast, unspecified: Secondary | ICD-10-CM

## 2011-01-08 ENCOUNTER — Ambulatory Visit: Payer: Managed Care, Other (non HMO) | Admitting: Cardiovascular Disease

## 2011-01-08 ENCOUNTER — Telehealth: Payer: Self-pay | Admitting: *Deleted

## 2011-01-08 NOTE — Telephone Encounter (Signed)
Mailed letter of missed app.

## 2011-02-22 ENCOUNTER — Other Ambulatory Visit: Payer: Self-pay | Admitting: *Deleted

## 2011-02-22 DIAGNOSIS — I10 Essential (primary) hypertension: Secondary | ICD-10-CM

## 2011-02-22 MED ORDER — LABETALOL HCL 300 MG PO TABS: 300.0000 mg | ORAL_TABLET | Freq: Two times a day (BID) | ORAL | Status: DC

## 2011-02-22 MED ORDER — POTASSIUM CHLORIDE ER 10 MEQ PO TBCR: 10.0000 meq | EXTENDED_RELEASE_TABLET | ORAL | Status: DC

## 2011-02-22 MED ORDER — HYDROCHLOROTHIAZIDE 25 MG PO TABS: 25.0000 mg | ORAL_TABLET | Freq: Every day | ORAL | Status: DC

## 2011-02-22 NOTE — Telephone Encounter (Signed)
Fax received from pharmacy. Refill completed. Jodette Terren Haberle RN  

## 2011-02-23 ENCOUNTER — Encounter: Payer: Self-pay | Admitting: Cardiovascular Disease

## 2011-02-24 ENCOUNTER — Ambulatory Visit
Admission: RE | Admit: 2011-02-24 | Discharge: 2011-02-24 | Disposition: A | Discharge status: Home / Self Care | Payer: Managed Care, Other (non HMO) | Source: Ambulatory Visit | Attending: Obstetrics and Gynecology | Admitting: Obstetrics and Gynecology

## 2011-02-24 ENCOUNTER — Other Ambulatory Visit: Payer: Self-pay | Admitting: Obstetrics and Gynecology

## 2011-02-24 ENCOUNTER — Other Ambulatory Visit: Payer: Self-pay | Admitting: *Deleted

## 2011-02-24 DIAGNOSIS — N649 Disorder of breast, unspecified: Secondary | ICD-10-CM

## 2011-02-24 MED ORDER — POTASSIUM CHLORIDE 10 MEQ PO CPCR: ORAL_CAPSULE | ORAL | Status: DC

## 2011-03-03 ENCOUNTER — Ambulatory Visit: Payer: Managed Care, Other (non HMO) | Admitting: Cardiovascular Disease

## 2011-04-06 LAB — TYPE AND SCREEN
ABO/RH(D): A POS
Antibody Screen: NEGATIVE

## 2011-04-06 LAB — COMPREHENSIVE METABOLIC PANEL
Albumin: 3.8
BUN: 7
Chloride: 103
Creatinine, Ser: 0.71
Total Bilirubin: 0.5
Total Protein: 7.3

## 2011-04-06 LAB — CBC
HCT: 39.9
HCT: 16.1 — ABNORMAL LOW
Hemoglobin: 6.1 — CL
Hemoglobin: 6.5 — CL
Hemoglobin: 5.6 — CL
MCHC: 34.2
MCHC: 34.8
MCHC: 34.8
MCV: 78.6
MCV: 79.4
MCV: 83.1
Platelets: 151
Platelets: 291
Platelets: 127 — ABNORMAL LOW
RBC: 2.24 — ABNORMAL LOW
RBC: 2.03 — ABNORMAL LOW
RDW: 14.2
RDW: 14.8
WBC: 14.1 — ABNORMAL HIGH

## 2011-04-13 ENCOUNTER — Ambulatory Visit (INDEPENDENT_AMBULATORY_CARE_PROVIDER_SITE_OTHER): Payer: Managed Care, Other (non HMO) | Admitting: Cardiovascular Disease

## 2011-04-13 ENCOUNTER — Encounter: Payer: Self-pay | Admitting: Cardiovascular Disease

## 2011-04-13 VITALS — BP 110/80 | HR 80 | Ht 62.0 in | Wt 178.1 lb

## 2011-04-13 DIAGNOSIS — I10 Essential (primary) hypertension: Secondary | ICD-10-CM

## 2011-04-13 NOTE — Assessment & Plan Note (Signed)
She is doing well.  Continue current meds.  She is trying to lose weight.  She may need a lower dose of labetalol if she loses weight. I'll see her again in one year.

## 2011-04-13 NOTE — Progress Notes (Signed)
[name] Date of Birth  02/06/1971 [facility] [address]    [address] [place], [place]  [address] [phone]  Fax  [phone]  History of Present Illness:  69 a female with a history of hypertension. She has done very well. She was started on labetalol. She tolerates this very well.  She's not had any episodes of chest pain or shortness of breath.  Current Outpatient Prescriptions on File Prior to Visit  Medication Sig Dispense Refill  . cetirizine (ZYRTEC) 10 MG tablet Take 10 mg by mouth as needed.        Marland Kitchen dextromethorphan-guaiFENesin (MUCINEX DM) 30-600 MG per 12 hr tablet Take 1 tablet by mouth as needed.        . GuaiFENesin (MUCINEX PO) Take by mouth as needed.        . hydrochlorothiazide 25 MG tablet Take 1 tablet (25 mg total) by mouth daily.  90 tablet  3  . ibuprofen (ADVIL,MOTRIN) 200 MG tablet Take 200 mg by mouth every 6 (six) hours as needed.        . labetalol (NORMODYNE) 300 MG tablet Take 1 tablet (300 mg total) by mouth 2 (two) times daily.  180 tablet  1  . Multiple Vitamin (MULTIVITAMIN) capsule Take 1 capsule by mouth daily.        . potassium chloride (MICRO-K) 10 MEQ CR capsule One capsule by mouth daily  90 capsule  3    Allergies  Allergen Reactions  . Motrin (Ibuprofen) Swelling  . Penicillins Hives  . Sulfa Antibiotics Hives    Past Medical History  Diagnosis Date  . Hypertension     No past surgical history on file.  History  Smoking status  . Never Smoker   Smokeless tobacco  . Not on file    History  Alcohol Use     Family History  Problem Relation Age of Onset  . Hypertension Mother   . Hypertension Father     Reviw of Systems:  Reviewed in the HPI.  All other systems are negative.  Physical Exam: BP 110/80  Pulse 80  Ht 5\' 2"  (1.575 m)  Wt 178 lb 1.9 oz (80.795 kg)  BMI 32.58 kg/m2 The patient is alert and oriented x 3.  The mood and affect are normal.   Skin: warm and dry.  Color is normal.     HEENT:   the sclera are nonicteric.  The mucous membranes are moist.  The carotids are 2+ without bruits.  There is no thyromegaly.  There is no JVD.    Lungs: clear.  The chest wall is non tender.    Heart: regular rate with a normal S1 and S2.  There are no murmurs, gallops, or rubs. The PMI is not displaced.     Abdomen: good bowel sounds.  There is no guarding or rebound.  There is no hepatosplenomegaly or tenderness.  There are no masses.   Extremities:  no clubbing, cyanosis, or edema.  The legs are without rashes.  The distal pulses are intact.   Neuro:  Cranial nerves II - XII are intact.  Motor and sensory functions are intact.    The gait is normal.  ECG:  Assessment / Plan:

## 2011-04-13 NOTE — Patient Instructions (Signed)
Your physician wants you to follow-up in one year, You will receive a reminder letter in the mail two months in advance. If you don't receive a letter, please call our office to schedule the follow-up appointment.   Your physician recommends that you continue on your current medications as directed. Please refer to the Current Medication list given to you today.

## 2011-04-19 LAB — DIFFERENTIAL
Basophils Absolute: 0.1
Basophils Relative: 1
Eosinophils Absolute: 0.2
Eosinophils Relative: 2
Lymphocytes Relative: 16
Monocytes Absolute: 1

## 2011-04-19 LAB — URINALYSIS, ROUTINE W REFLEX MICROSCOPIC
Bilirubin Urine: NEGATIVE
Ketones, ur: 40 — AB
Nitrite: NEGATIVE
Nitrite: NEGATIVE
Protein, ur: NEGATIVE
Specific Gravity, Urine: 1.01
Specific Gravity, Urine: 1.02
Urobilinogen, UA: 0.2
Urobilinogen, UA: 0.2
pH: 6.5

## 2011-04-19 LAB — CBC
HCT: 31.8 — ABNORMAL LOW
HCT: 36.2
HCT: 37.4
Hemoglobin: 10.6 — ABNORMAL LOW
Hemoglobin: 12.2
Hemoglobin: 12.7
MCHC: 33.5
MCHC: 33.6
MCHC: 34.1
MCV: 79.1
MCV: 79.5
MCV: 80
Platelets: 219
Platelets: 219
Platelets: 243
RBC: 3.98
RBC: 4.56
RBC: 4.72
RDW: 12.2
RDW: 12.6
RDW: 12.7
WBC: 12.3 — ABNORMAL HIGH
WBC: 13.3 — ABNORMAL HIGH
WBC: 13.4 — ABNORMAL HIGH

## 2011-04-19 LAB — URINE MICROSCOPIC-ADD ON

## 2011-04-19 LAB — URINE CULTURE
Colony Count: NO GROWTH
Culture: NO GROWTH

## 2011-04-20 LAB — CBC
MCHC: 33.2
MCV: 79.8
Platelets: 238
RBC: 4.69
RDW: 12.4

## 2011-04-20 LAB — ABO/RH: ABO/RH(D): A POS

## 2011-06-02 ENCOUNTER — Encounter: Payer: Self-pay | Admitting: Family Medicine

## 2011-06-02 ENCOUNTER — Ambulatory Visit (INDEPENDENT_AMBULATORY_CARE_PROVIDER_SITE_OTHER): Payer: Managed Care, Other (non HMO) | Admitting: Family Medicine

## 2011-06-02 VITALS — BP 130/91 | HR 80 | Temp 98.1°F | Ht 63.0 in | Wt 175.0 lb

## 2011-06-02 DIAGNOSIS — M545 Low back pain: Secondary | ICD-10-CM

## 2011-06-02 MED ORDER — HYDROCODONE-ACETAMINOPHEN 5-500 MG PO TABS: 1.0000 | ORAL_TABLET | Freq: Four times a day (QID) | ORAL | Status: AC | PRN

## 2011-06-02 MED ORDER — CYCLOBENZAPRINE HCL 10 MG PO TABS: 10.0000 mg | ORAL_TABLET | Freq: Three times a day (TID) | ORAL | Status: DC | PRN

## 2011-06-02 MED ORDER — MELOXICAM 15 MG PO TABS: 15.0000 mg | ORAL_TABLET | Freq: Every day | ORAL | Status: DC

## 2011-06-02 NOTE — Assessment & Plan Note (Signed)
believe this is musculoskeletal and not related to discogenic or vertebral source based on her exam and history.  Start PT, meloxicam.  Vicodin and flexeril as needed.  Heat/ice as needed.  See instructions for further.  F/u in 5-6 weeks for recheck - consider x-rays and MRI if not improving as expected.

## 2011-06-02 NOTE — Progress Notes (Signed)
Subjective:    Patient ID: [name], female    DOB: 06/20/1971, 40 y.o.   MRN: [id number]  PCP: Dr. Merri Brunette  HPI 40 yo F here for low back pain.  Patient denies any known injury. States about 2 months ago developed mild low back pain across low back. No radiation into legs. Has worsened since 2 months ago, worse with flexion more than extension. No numbness/tingling. No bowel/bladder dysfunction. Has not had prior workup or physical therapy. Takes advil for this (has motrin listed as allergy - stated when she took high doses of name brand motrin this caused swelling in face but has not had this issue with generic ibuprofen, advil, or other nsaids). Using heat as well. Occasionally has woken her up at night.  Past Medical History  Diagnosis Date  . Hypertension   . Mitral valve prolapse     Current Outpatient Prescriptions on File Prior to Visit  Medication Sig Dispense Refill  . hydrochlorothiazide 25 MG tablet Take 1 tablet (25 mg total) by mouth daily.  90 tablet  3  . labetalol (NORMODYNE) 300 MG tablet Take 1 tablet (300 mg total) by mouth 2 (two) times daily.  180 tablet  1  . Multiple Vitamin (MULTIVITAMIN) capsule Take 1 capsule by mouth daily.        . potassium chloride (MICRO-K) 10 MEQ CR capsule One capsule by mouth daily  90 capsule  3    Past Surgical History  Procedure Date  . Breast lumpectomy   . Myomectomy     Allergies  Allergen Reactions  . Motrin (Ibuprofen) Swelling  . Penicillins Hives  . Sulfa Antibiotics Hives    History   Social History  . Marital Status: Married    Spouse Name: N/A    Number of Children: N/A  . Years of Education: N/A   Occupational History  . Not on file.   Social History Main Topics  . Smoking status: Never Smoker   . Smokeless tobacco: Not on file  . Alcohol Use: Not on file  . Drug Use: No  . Sexually Active: Not on file   Other Topics Concern  . Not on file   Social History Narrative  .  No narrative on file    Family History  Problem Relation Age of Onset  . Hypertension Mother   . Hyperlipidemia Mother   . Diabetes Mother   . Hypertension Father   . Hyperlipidemia Father   . Diabetes Father   . Heart attack Maternal Uncle   . Diabetes Maternal Grandmother   . Hyperlipidemia Maternal Grandmother   . Hypertension Maternal Grandmother   . Sudden death Neg Hx     BP 130/91  Pulse 80  Temp(Src) 98.1 F (36.7 C) (Oral)  Ht 5\' 3"  (1.6 m)  Wt 175 lb (79.379 kg)  BMI 31.00 kg/m2  Review of Systems See HPI.    Objective:   Physical Exam Gen: NAD  Back: No gross deformity, scoliosis. TTP bilateral lumbar paraspinal muscles.  No focal bony TTP. FROM with mild pain on full flexion > extension. Strength LEs 5/5 all muscle groups.   2+ MSRs in patellar and achilles tendons, equal bilaterally. Negative SLRs (pain in back with left SLR but not down leg at all). Sensation intact to light touch bilaterally. Negative logroll bilateral hips Negative fabers and piriformis stretches.    Assessment & Plan:  1. Low back pain - believe this is musculoskeletal and not related to discogenic  or vertebral source based on her exam and history.  Start PT, meloxicam.  Vicodin and flexeril as needed.  Heat/ice as needed.  See instructions for further.  F/u in 5-6 weeks for recheck - consider x-rays and MRI if not improving as expected.

## 2011-06-02 NOTE — Patient Instructions (Signed)
You have musculoskeletal low back pain Take tylenol for baseline pain relief (1-2 extra strength tabs 3x/day) during the day (don't take this at same time as vicodin as vicodin has tylenol in it). Meloxicam daily with food for pain and inflammation. Vicodin as needed for severe pain (no driving on this medicine - we don't provide refills of this but can step you down to a more mild one if necessary). Flexeril as needed for muscle spasms (no driving on this medicine if it makes you sleepy). Stay as active as possible. Start physical therapy for stretches, exercises, and modalities - do home exercises they show you on days you don't go.  Transition to a home program when able. Consider massage, chiropractor, physical therapy, and/or acupuncture. Physical therapy has been shown to be helpful while the others have mixed results. Strengthening of low back muscles, abdominal musculature are key for long term pain relief. If not improving, will consider further imaging (x-rays, MRI) and/or other medications (neurontin, lyrica, nortriptyline) that help with pain. Follow up with me in 5-6 weeks.

## 2011-06-07 ENCOUNTER — Ambulatory Visit: Payer: Managed Care, Other (non HMO) | Attending: Family Medicine | Admitting: Physical Therapy

## 2011-06-07 DIAGNOSIS — M545 Low back pain, unspecified: Secondary | ICD-10-CM | POA: Insufficient documentation

## 2011-06-07 DIAGNOSIS — IMO0001 Reserved for inherently not codable concepts without codable children: Secondary | ICD-10-CM | POA: Insufficient documentation

## 2011-06-07 DIAGNOSIS — M2569 Stiffness of other specified joint, not elsewhere classified: Secondary | ICD-10-CM | POA: Insufficient documentation

## 2011-06-17 ENCOUNTER — Encounter: Payer: Managed Care, Other (non HMO) | Admitting: Physical Therapy

## 2011-06-17 ENCOUNTER — Ambulatory Visit: Payer: Managed Care, Other (non HMO) | Attending: Family Medicine | Admitting: Physical Therapy

## 2011-06-17 DIAGNOSIS — M2569 Stiffness of other specified joint, not elsewhere classified: Secondary | ICD-10-CM | POA: Insufficient documentation

## 2011-06-17 DIAGNOSIS — IMO0001 Reserved for inherently not codable concepts without codable children: Secondary | ICD-10-CM | POA: Insufficient documentation

## 2011-06-17 DIAGNOSIS — M545 Low back pain, unspecified: Secondary | ICD-10-CM | POA: Insufficient documentation

## 2011-06-24 ENCOUNTER — Ambulatory Visit: Payer: Managed Care, Other (non HMO) | Admitting: Physical Therapy

## 2011-06-30 ENCOUNTER — Ambulatory Visit: Payer: Managed Care, Other (non HMO) | Admitting: Physical Therapy

## 2011-07-08 ENCOUNTER — Ambulatory Visit: Payer: Managed Care, Other (non HMO) | Admitting: Physical Therapy

## 2011-07-22 ENCOUNTER — Ambulatory Visit: Payer: Managed Care, Other (non HMO) | Attending: Family Medicine | Admitting: Physical Therapy

## 2011-07-22 DIAGNOSIS — M2569 Stiffness of other specified joint, not elsewhere classified: Secondary | ICD-10-CM | POA: Insufficient documentation

## 2011-07-22 DIAGNOSIS — M545 Low back pain, unspecified: Secondary | ICD-10-CM | POA: Insufficient documentation

## 2011-07-22 DIAGNOSIS — IMO0001 Reserved for inherently not codable concepts without codable children: Secondary | ICD-10-CM | POA: Insufficient documentation

## 2011-08-05 ENCOUNTER — Ambulatory Visit: Payer: Managed Care, Other (non HMO) | Admitting: Physical Therapy

## 2011-08-19 ENCOUNTER — Ambulatory Visit: Payer: Managed Care, Other (non HMO) | Attending: Family Medicine | Admitting: Physical Therapy

## 2011-08-19 DIAGNOSIS — M545 Low back pain, unspecified: Secondary | ICD-10-CM | POA: Insufficient documentation

## 2011-08-19 DIAGNOSIS — IMO0001 Reserved for inherently not codable concepts without codable children: Secondary | ICD-10-CM | POA: Insufficient documentation

## 2011-08-19 DIAGNOSIS — M2569 Stiffness of other specified joint, not elsewhere classified: Secondary | ICD-10-CM | POA: Insufficient documentation

## 2011-08-24 ENCOUNTER — Ambulatory Visit: Payer: Managed Care, Other (non HMO) | Admitting: Physical Therapy

## 2011-08-25 ENCOUNTER — Ambulatory Visit: Payer: Managed Care, Other (non HMO) | Admitting: Physical Therapy

## 2011-08-26 ENCOUNTER — Ambulatory Visit: Payer: Managed Care, Other (non HMO) | Admitting: Physical Therapy

## 2011-09-02 ENCOUNTER — Ambulatory Visit: Payer: Managed Care, Other (non HMO) | Admitting: Physical Therapy

## 2011-09-09 ENCOUNTER — Ambulatory Visit: Payer: Managed Care, Other (non HMO) | Admitting: Physical Therapy

## 2011-09-13 ENCOUNTER — Ambulatory Visit: Payer: Managed Care, Other (non HMO) | Attending: Family Medicine | Admitting: Physical Therapy

## 2011-09-13 DIAGNOSIS — M545 Low back pain, unspecified: Secondary | ICD-10-CM | POA: Insufficient documentation

## 2011-09-13 DIAGNOSIS — IMO0001 Reserved for inherently not codable concepts without codable children: Secondary | ICD-10-CM | POA: Insufficient documentation

## 2011-09-13 DIAGNOSIS — M2569 Stiffness of other specified joint, not elsewhere classified: Secondary | ICD-10-CM | POA: Insufficient documentation

## 2011-09-21 ENCOUNTER — Ambulatory Visit: Payer: Managed Care, Other (non HMO) | Admitting: Physical Therapy

## 2011-09-22 ENCOUNTER — Ambulatory Visit: Payer: Managed Care, Other (non HMO) | Admitting: Physical Therapy

## 2011-09-27 ENCOUNTER — Encounter: Payer: Managed Care, Other (non HMO) | Admitting: Physical Therapy

## 2011-09-28 ENCOUNTER — Ambulatory Visit: Payer: Managed Care, Other (non HMO) | Admitting: Physical Therapy

## 2011-10-19 ENCOUNTER — Ambulatory Visit: Payer: Managed Care, Other (non HMO) | Attending: Family Medicine | Admitting: Physical Therapy

## 2011-10-19 DIAGNOSIS — M2569 Stiffness of other specified joint, not elsewhere classified: Secondary | ICD-10-CM | POA: Insufficient documentation

## 2011-10-19 DIAGNOSIS — M545 Low back pain, unspecified: Secondary | ICD-10-CM | POA: Insufficient documentation

## 2011-10-19 DIAGNOSIS — IMO0001 Reserved for inherently not codable concepts without codable children: Secondary | ICD-10-CM | POA: Insufficient documentation

## 2011-12-27 ENCOUNTER — Other Ambulatory Visit: Payer: Self-pay | Admitting: Cardiovascular Disease

## 2011-12-27 MED ORDER — LABETALOL HCL 300 MG PO TABS: 300.0000 mg | ORAL_TABLET | Freq: Two times a day (BID) | ORAL | Status: DC

## 2011-12-27 NOTE — Telephone Encounter (Signed)
Pt also needs Kt filled as well

## 2011-12-29 ENCOUNTER — Other Ambulatory Visit: Payer: Self-pay | Admitting: *Deleted

## 2011-12-29 MED ORDER — POTASSIUM CHLORIDE ER 10 MEQ PO TBCR: 10.0000 meq | EXTENDED_RELEASE_TABLET | Freq: Every day | ORAL | Status: DC

## 2011-12-29 MED ORDER — LABETALOL HCL 300 MG PO TABS: 300.0000 mg | ORAL_TABLET | Freq: Two times a day (BID) | ORAL | Status: DC

## 2011-12-29 NOTE — Telephone Encounter (Signed)
Fax Received. Refill Completed. [name] (R.M.A)   

## 2012-04-20 ENCOUNTER — Other Ambulatory Visit: Payer: Self-pay | Admitting: Obstetrics and Gynecology

## 2012-04-20 DIAGNOSIS — Z1231 Encounter for screening mammogram for malignant neoplasm of breast: Secondary | ICD-10-CM

## 2012-05-01 ENCOUNTER — Other Ambulatory Visit: Payer: Self-pay | Admitting: Cardiovascular Disease

## 2012-05-01 ENCOUNTER — Telehealth: Payer: Self-pay | Admitting: Cardiovascular Disease

## 2012-05-01 MED ORDER — POTASSIUM CHLORIDE ER 10 MEQ PO CPCR: 10.0000 meq | ORAL_CAPSULE | Freq: Every day | ORAL | Status: DC

## 2012-05-01 NOTE — Telephone Encounter (Signed)
F/U for labs. lmovm to call back regarding K+, pt need Lab appointment

## 2012-05-01 NOTE — Telephone Encounter (Signed)
New Problem:    Patient called in needing a refill of her potassium chloride (K-DUR) 10 MEQ tablet in capsule form not tablet form.  Patient was receiving the capsules until this latest refill where she was switched back to the tablets.

## 2012-05-02 ENCOUNTER — Other Ambulatory Visit: Payer: Self-pay | Admitting: *Deleted

## 2012-05-02 DIAGNOSIS — I10 Essential (primary) hypertension: Secondary | ICD-10-CM

## 2012-05-02 MED ORDER — HYDROCHLOROTHIAZIDE 25 MG PO TABS: 25.0000 mg | ORAL_TABLET | Freq: Every day | ORAL | Status: DC

## 2012-05-04 NOTE — Telephone Encounter (Signed)
Opened in Error.

## 2012-05-09 NOTE — Telephone Encounter (Signed)
Pt has f/u app with Dr Elease Hashimoto, bmet will be drawn then.

## 2012-05-23 ENCOUNTER — Ambulatory Visit (INDEPENDENT_AMBULATORY_CARE_PROVIDER_SITE_OTHER): Payer: Managed Care, Other (non HMO) | Admitting: Cardiovascular Disease

## 2012-05-23 ENCOUNTER — Encounter: Payer: Self-pay | Admitting: Cardiovascular Disease

## 2012-05-23 VITALS — BP 114/92 | HR 91 | Ht 63.0 in | Wt 182.0 lb

## 2012-05-23 DIAGNOSIS — I10 Essential (primary) hypertension: Secondary | ICD-10-CM

## 2012-05-23 LAB — LIPID PANEL
Cholesterol: 193 mg/dL (ref 0–200)
LDL Cholesterol: 117 mg/dL — ABNORMAL HIGH (ref 0–99)
VLDL: 30.2 mg/dL (ref 0.0–40.0)

## 2012-05-23 LAB — HEPATIC FUNCTION PANEL
Bilirubin, Direct: 0 mg/dL (ref 0.0–0.3)
Total Protein: 7.4 g/dL (ref 6.0–8.3)

## 2012-05-23 LAB — BASIC METABOLIC PANEL
BUN: 10 mg/dL (ref 6–23)
CO2: 28 mEq/L (ref 19–32)
Calcium: 9.4 mg/dL (ref 8.4–10.5)
Chloride: 99 mEq/L (ref 96–112)
Creatinine, Ser: 0.7 mg/dL (ref 0.4–1.2)
GFR: 111.51 mL/min (ref >60.00)
Glucose, Bld: 94 mg/dL (ref 70–99)
Potassium: 3.8 mEq/L (ref 3.5–5.1)
Sodium: 136 mEq/L (ref 135–145)

## 2012-05-23 MED ORDER — POTASSIUM CHLORIDE ER 10 MEQ PO CPCR: 10.0000 meq | ORAL_CAPSULE | Freq: Every day | ORAL | Status: DC

## 2012-05-23 MED ORDER — HYDROCHLOROTHIAZIDE 25 MG PO TABS: 25.0000 mg | ORAL_TABLET | Freq: Every day | ORAL | Status: DC

## 2012-05-23 MED ORDER — LABETALOL HCL 300 MG PO TABS: 300.0000 mg | ORAL_TABLET | Freq: Two times a day (BID) | ORAL | Status: DC

## 2012-05-23 NOTE — Assessment & Plan Note (Signed)
[name] seems to be doing fairly well. I encouraged her to continue with a good diet and exercise program. I think she needs to exercise a little bit more which would actually help her hypertension.

## 2012-05-23 NOTE — Progress Notes (Signed)
[name] Date of Birth  April 14, 1971 [facility] [address]    [address] [place], [place]  [address] [phone]  Fax  [phone]  History of Present Illness:  31 a female with a history of hypertension. She has done very well. She was started on labetalol. She tolerates this very well.  She's not had any episodes of chest pain or shortness of breath.  She has a cold now and is on Claritin -D.  She has not been exercising regularly - occasionally once a week.  Current Outpatient Prescriptions on File Prior to Visit  Medication Sig Dispense Refill  . hydrochlorothiazide (HYDRODIURIL) 25 MG tablet Take 1 tablet (25 mg total) by mouth daily.  90 tablet  0  . labetalol (NORMODYNE) 300 MG tablet Take 1 tablet (300 mg total) by mouth 2 (two) times daily.  180 tablet  3  . Multiple Vitamin (MULTIVITAMIN) capsule Take 1 capsule by mouth daily.        . potassium chloride (MICRO-K) 10 MEQ CR capsule Take 1 capsule (10 mEq total) by mouth daily.  90 capsule  0    Allergies  Allergen Reactions  . Motrin (Ibuprofen) Swelling  . Penicillins Hives  . Sulfa Antibiotics Hives    Past Medical History  Diagnosis Date  . Hypertension   . Mitral valve prolapse     Past Surgical History  Procedure Date  . Breast lumpectomy   . Myomectomy     History  Smoking status  . Never Smoker   Smokeless tobacco  . Not on file    History  Alcohol Use: Not on file    Family History  Problem Relation Age of Onset  . Hypertension Mother   . Hyperlipidemia Mother   . Diabetes Mother   . Hypertension Father   . Hyperlipidemia Father   . Diabetes Father   . Heart attack Maternal Uncle   . Diabetes Maternal Grandmother   . Hyperlipidemia Maternal Grandmother   . Hypertension Maternal Grandmother   . Sudden death Neg Hx     Reviw of Systems:  Reviewed in the HPI.  All other systems are negative.  Physical Exam: BP 114/92  Pulse 91  Ht 5\' 3"  (1.6 m)  Wt 182 lb  (82.555 kg)  BMI 32.24 kg/m2 The patient is alert and oriented x 3.  The mood and affect are normal.   Skin: warm and dry.  Color is normal.    HEENT:   the sclera are nonicteric.  The mucous membranes are moist.  The carotids are 2+ without bruits.  There is no thyromegaly.  There is no JVD.    Lungs: clear.  The chest wall is non tender.    Heart: regular rate with a normal S1 and S2.  There are no murmurs, gallops, or rubs. The PMI is not displaced.     Abdomen: good bowel sounds.  There is no guarding or rebound.  There is no hepatosplenomegaly or tenderness.  There are no masses.   Extremities:  no clubbing, cyanosis, or edema.  The legs are without rashes.  The distal pulses are intact.   Neuro:  Cranial nerves II - XII are intact.  Motor and sensory functions are intact.    The gait is normal.  ECG: 05/23/2012-normal sinus rhythm at 91 beats a minute. She has no ST or T wave changes. Has a left anterior fascicular block. Assessment / Plan:

## 2012-05-23 NOTE — Patient Instructions (Addendum)
Your physician wants you to follow-up in: 1 YEAR  You will receive a reminder letter in the mail two months in advance. If you don't receive a letter, please call our office to schedule the follow-up appointment.   Your physician recommends that you return for lab work in: TODAY AND IN 1 YEAR   Your physician recommends that you continue on your current medications as directed. Please refer to the Current Medication list given to you today.   

## 2012-05-29 ENCOUNTER — Ambulatory Visit: Payer: Managed Care, Other (non HMO)

## 2012-07-07 ENCOUNTER — Ambulatory Visit
Admission: RE | Admit: 2012-07-07 | Discharge: 2012-07-07 | Disposition: A | Discharge status: Home / Self Care | Payer: Managed Care, Other (non HMO) | Source: Ambulatory Visit | Attending: Obstetrics and Gynecology | Admitting: Obstetrics and Gynecology

## 2012-07-07 DIAGNOSIS — Z1231 Encounter for screening mammogram for malignant neoplasm of breast: Secondary | ICD-10-CM

## 2013-06-27 ENCOUNTER — Other Ambulatory Visit: Payer: Self-pay | Admitting: Cardiovascular Disease

## 2013-07-23 ENCOUNTER — Encounter: Payer: Self-pay | Admitting: Cardiovascular Disease

## 2013-07-31 ENCOUNTER — Encounter: Payer: Self-pay | Admitting: Cardiovascular Disease

## 2013-07-31 ENCOUNTER — Ambulatory Visit (INDEPENDENT_AMBULATORY_CARE_PROVIDER_SITE_OTHER): Payer: Managed Care, Other (non HMO) | Admitting: Cardiovascular Disease

## 2013-07-31 VITALS — BP 138/92 | HR 85 | Ht 62.5 in | Wt 175.0 lb

## 2013-07-31 DIAGNOSIS — I10 Essential (primary) hypertension: Secondary | ICD-10-CM

## 2013-07-31 LAB — HEPATIC FUNCTION PANEL
ALK PHOS: 37 U/L — AB (ref 39–117)
ALT: 24 U/L (ref 0–35)
AST: 24 U/L (ref 0–37)
Albumin: 4 g/dL (ref 3.5–5.2)
BILIRUBIN DIRECT: 0 mg/dL (ref 0.0–0.3)
TOTAL PROTEIN: 7.7 g/dL (ref 6.0–8.3)
Total Bilirubin: 0.5 mg/dL (ref 0.3–1.2)

## 2013-07-31 LAB — BASIC METABOLIC PANEL
BUN: 12 mg/dL (ref 6–23)
CALCIUM: 9.3 mg/dL (ref 8.4–10.5)
CHLORIDE: 104 meq/L (ref 96–112)
CO2: 27 mEq/L (ref 19–32)
CREATININE: 0.7 mg/dL (ref 0.4–1.2)
GFR: 114.52 mL/min (ref >60.00)
Glucose, Bld: 93 mg/dL (ref 70–99)
Potassium: 3.4 mEq/L — ABNORMAL LOW (ref 3.5–5.1)
Sodium: 137 mEq/L (ref 135–145)

## 2013-07-31 LAB — LIPID PANEL
CHOLESTEROL: 199 mg/dL (ref 0–200)
HDL: 41.5 mg/dL (ref >39.00)
LDL CALC: 125 mg/dL — AB (ref 0–99)
Total CHOL/HDL Ratio: 5
Triglycerides: 161 mg/dL — ABNORMAL HIGH (ref 0.0–149.0)
VLDL: 32.2 mg/dL (ref 0.0–40.0)

## 2013-07-31 MED ORDER — HYDROCHLOROTHIAZIDE 25 MG PO TABS: 25.0000 mg | ORAL_TABLET | Freq: Every day | ORAL | Status: DC

## 2013-07-31 MED ORDER — LABETALOL HCL 300 MG PO TABS: 300.0000 mg | ORAL_TABLET | Freq: Two times a day (BID) | ORAL | Status: DC

## 2013-07-31 MED ORDER — POTASSIUM CHLORIDE ER 10 MEQ PO CPCR: 10.0000 meq | ORAL_CAPSULE | Freq: Every day | ORAL | Status: DC

## 2013-07-31 NOTE — Progress Notes (Signed)
[name] Date of Birth  1971-06-28 [facility] [address]    [address] [place], [place]  [id number] [phone]  Fax  [phone]  History of Present Illness:  05/23/12:; [age] a female with a history of hypertension. She has done very well. She was started on labetalol. She tolerates this very well.  She's not had any episodes of chest pain or shortness of breath.  She has a cold now and is on Claritin -D.  She has not been exercising regularly - occasionally once a week.  [name] 20, 2015:  [name] is doing ok  Her father had a CVA and she is having to deal with this issue.    She has not been checking her BP.  She has been feeling well.    Current Outpatient Prescriptions on File Prior to Visit  Medication Sig Dispense Refill  . hydrochlorothiazide (HYDRODIURIL) 25 MG tablet TAKE 1 TABLET BY MOUTH EVERY DAY  90 tablet  0  . labetalol (NORMODYNE) 300 MG tablet Take 1 tablet (300 mg total) by mouth 2 (two) times daily.  180 tablet  3  . Multiple Vitamin (MULTIVITAMIN) capsule Take 1 capsule by mouth daily.        . potassium chloride (MICRO-K) 10 MEQ CR capsule TAKE ONE CAPSULE BY MOUTH EVERY DAY  90 capsule  0   No current facility-administered medications on file prior to visit.    Allergies  Allergen Reactions  . Motrin [Ibuprofen] Swelling  . Penicillins Hives  . Sulfa Antibiotics Hives    Past Medical History  Diagnosis Date  . Hypertension   . Mitral valve prolapse     Past Surgical History  Procedure Laterality Date  . Breast lumpectomy    . Myomectomy      History  Smoking status  . Never Smoker   Smokeless tobacco  . Not on file    History  Alcohol Use: Not on file    Family History  Problem Relation Age of Onset  . Hypertension Mother   . Hyperlipidemia Mother   . Diabetes Mother   . Hypertension Father   . Hyperlipidemia Father   . Diabetes Father   . Heart attack Maternal Uncle   . Diabetes Maternal Grandmother   .  Hyperlipidemia Maternal Grandmother   . Hypertension Maternal Grandmother   . Sudden death Neg Hx     Reviw of Systems:  Reviewed in the HPI.  All other systems are negative.  Physical Exam: BP 138/92  Pulse 85  Ht 5' 2.5" (1.588 m)  Wt 175 lb (79.379 kg)  BMI 31.48 kg/m2 The patient is alert and oriented x 3.  The mood and affect are normal.   Skin: warm and dry.  Color is normal.    HEENT:   the sclera are nonicteric.  The mucous membranes are moist.  The carotids are 2+ without bruits.  There is no thyromegaly.  There is no JVD.    Lungs: clear.  The chest wall is non tender.    Heart: regular rate with a normal S1 and S2.  There are no murmurs, gallops, or rubs. The PMI is not displaced.     Abdomen: good bowel sounds.  There is no guarding or rebound.  There is no hepatosplenomegaly or tenderness.  There are no masses.   Extremities:  no clubbing, cyanosis, or edema.  The legs are without rashes.  The distal pulses are intact.   Neuro:  Cranial nerves II -  XII are intact.  Motor and sensory functions are intact.    The gait is normal.  ECG: 05/23/2012-normal sinus rhythm at 91 beats a minute. She has no ST or T wave changes. Has a left anterior fascicular block. Assessment / Plan:

## 2013-07-31 NOTE — Patient Instructions (Signed)
Your physician recommends that you return for lab work in: today  Your physician wants you to follow-up in: 1 year  You will receive a reminder letter in the mail two months in advance. If you don't receive a letter, please call our office to schedule the follow-up appointment.   Your physician recommends that you continue on your current medications as directed. Please refer to the Current Medication list given to you today.   

## 2013-07-31 NOTE — Assessment & Plan Note (Signed)
[name] is doing ok.  BP has been well controlled.  She has not been exercising as much as she wants.  Will continue current meds.  She is looking forward to exercising more this year.  Will draw fasting labs today.  I will see her again in 1 year for office visit and repeat labs.

## 2013-08-02 ENCOUNTER — Telehealth: Payer: Self-pay | Admitting: *Deleted

## 2013-08-02 DIAGNOSIS — I10 Essential (primary) hypertension: Secondary | ICD-10-CM

## 2013-08-02 MED ORDER — POTASSIUM CHLORIDE ER 10 MEQ PO CPCR: 10.0000 meq | ORAL_CAPSULE | Freq: Two times a day (BID) | ORAL | Status: DC

## 2013-08-02 NOTE — Telephone Encounter (Signed)
Message copied by Antony OdeaBRILEY, Verlena Marlette J on Thu Aug 02, 2013  4:08 PM ------      Message from: Vesta MixerNAHSER, PHILIP J      Created: Tue Jul 31, 2013  9:34 PM       k is low.  Increase kdur to 20 a day       ------

## 2013-08-02 NOTE — Telephone Encounter (Signed)
Pt aware of labs and advice/ pt will come back in 6 weeks to repeat bmet.

## 2013-09-02 IMAGING — MG MM DIGITAL SCREENING BILAT
9 series · 9 of 25 positions shown · non-contrast
Comparison: Previous exams.

CLINICAL DATA: Screening.

DIGITAL BILATERAL SCREENING MAMMOGRAM WITH CAD
DIGITAL BREAST TOMOSYNTHESIS
Digital breast tomosynthesis images are acquired in two
projections.  These images are reviewed in combination with the
digital mammogram, confirming the findings below.

[L MLO (1 of 2)]
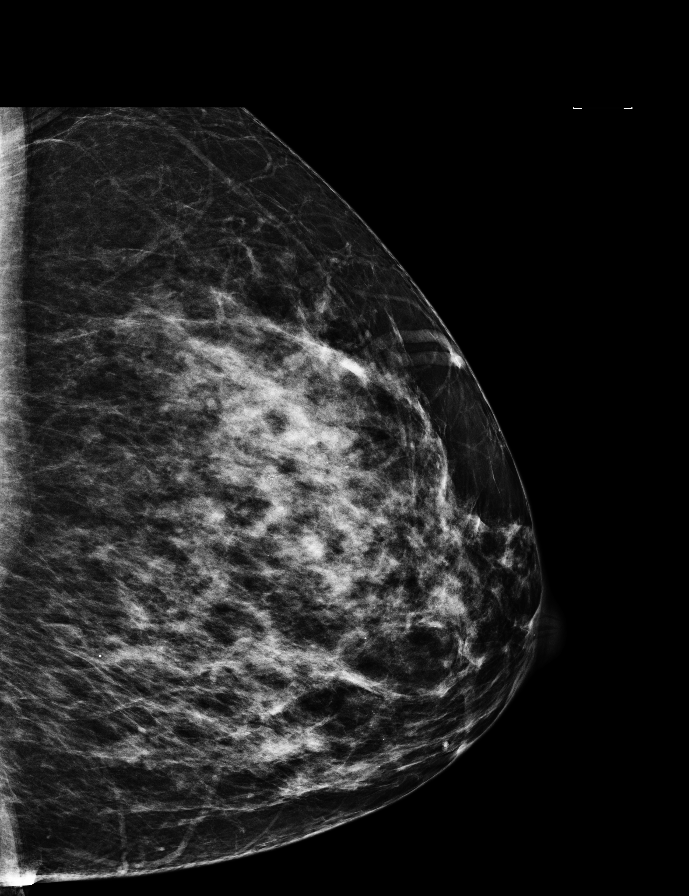

[L CC]
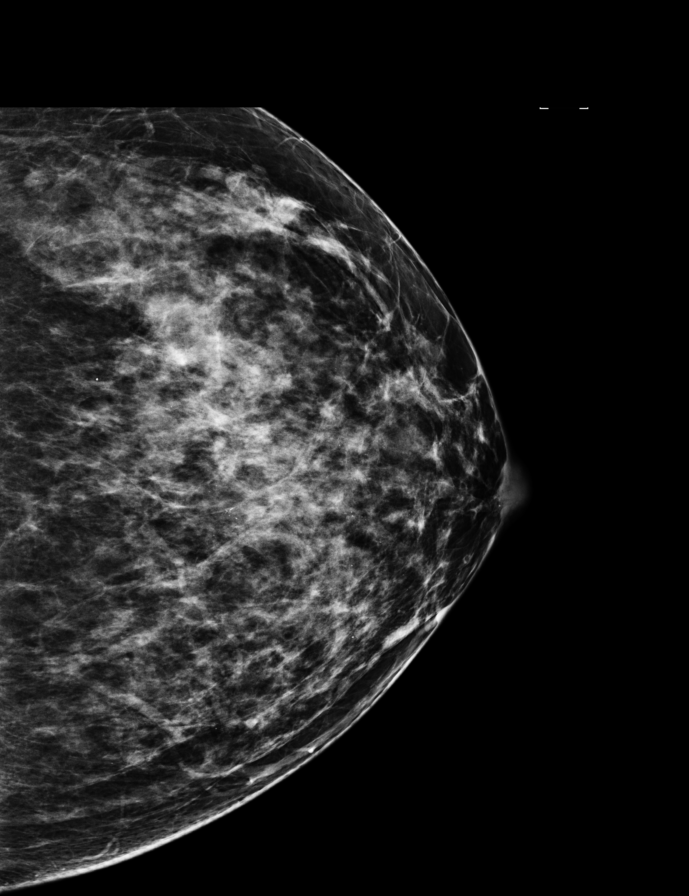

[L MLO (2 of 2)]
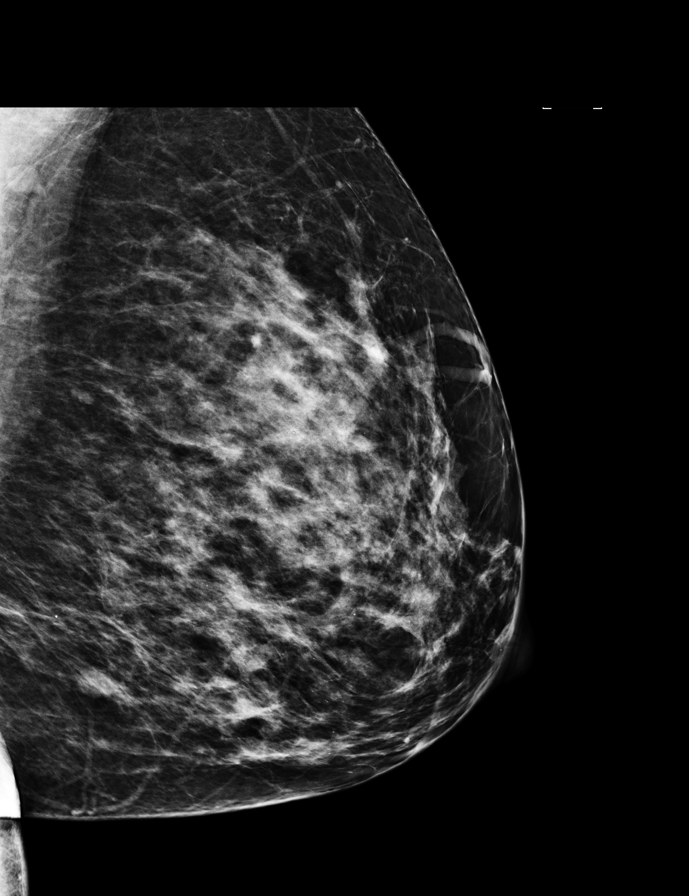

[R MLO]
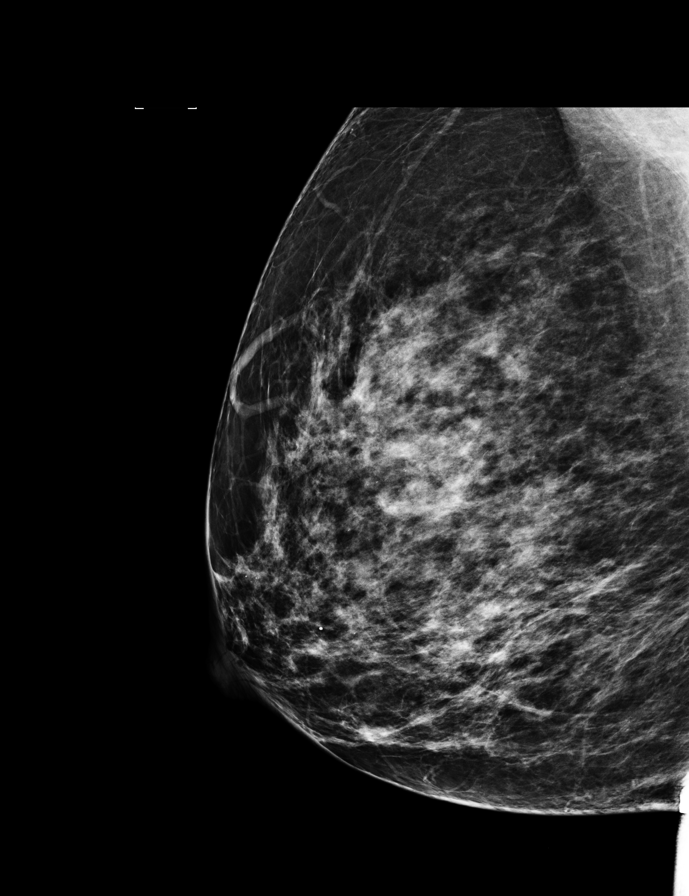

[R CC]
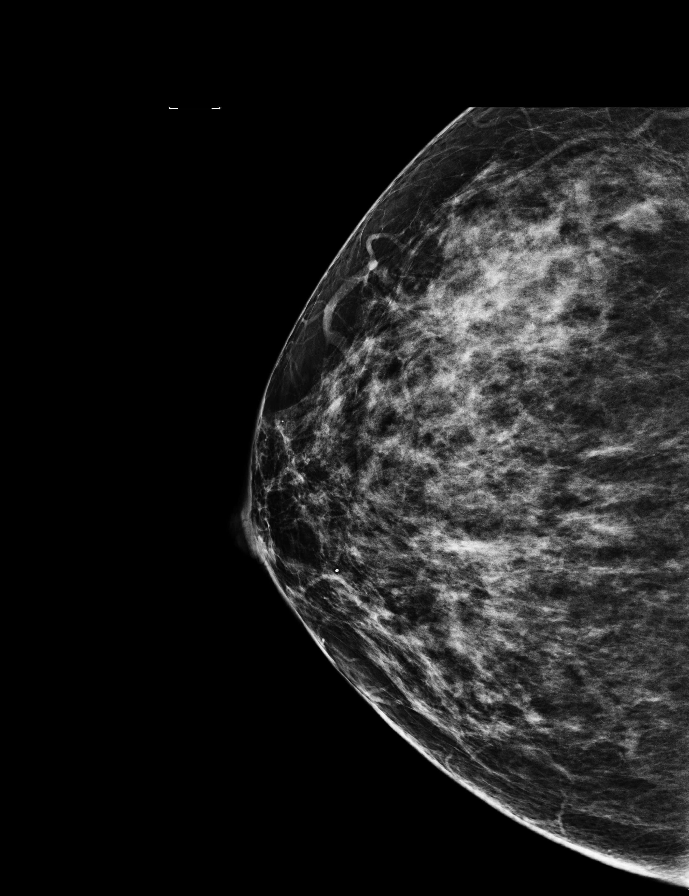

[L CC tomo · tomo slice 36/71.0]
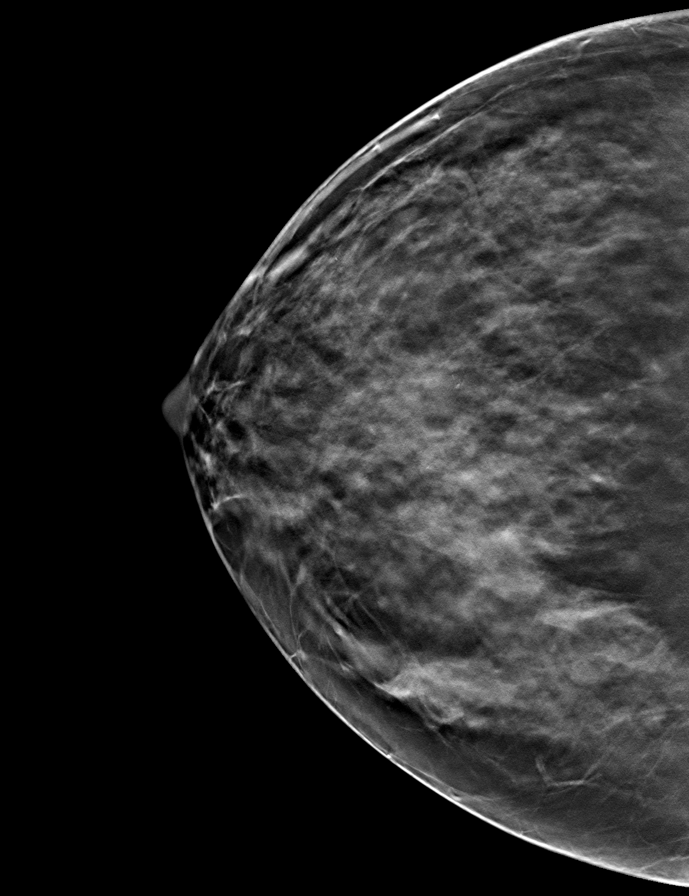

[L MLO tomo · tomo slice 39/77.0]
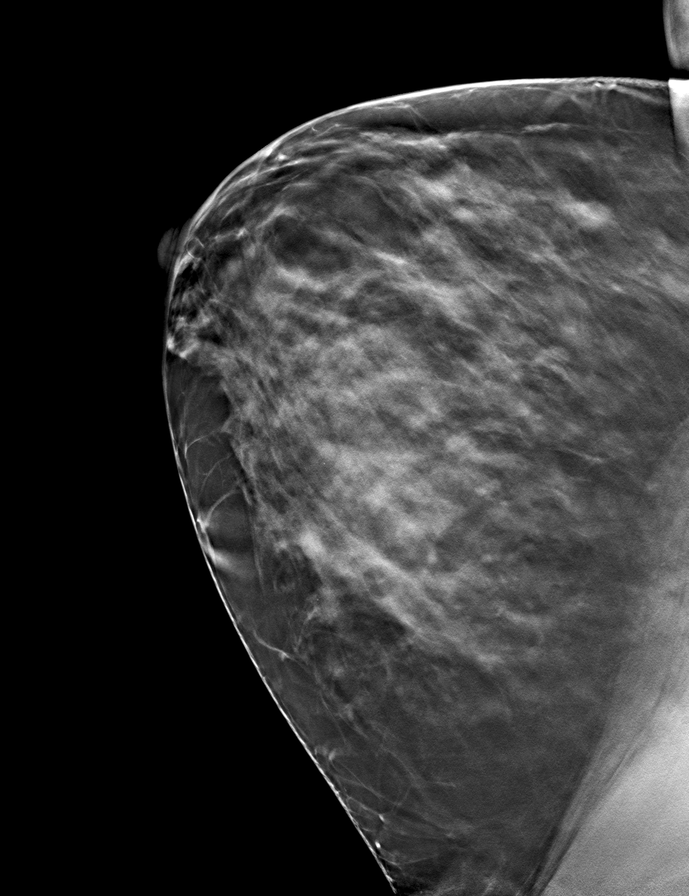

[R CC tomo · tomo slice 37/73.0]
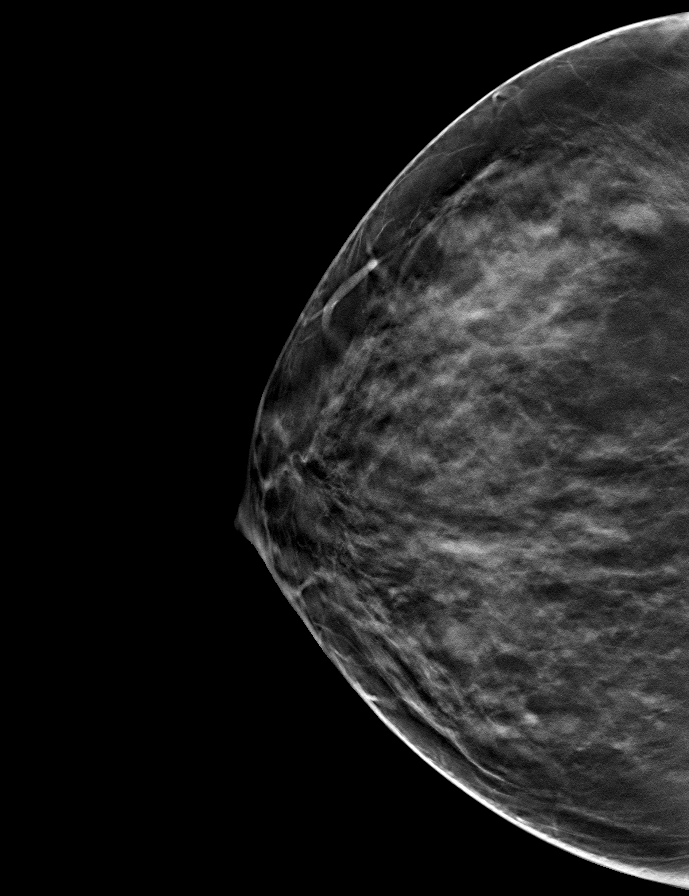

[R MLO tomo · tomo slice 39/77.0]
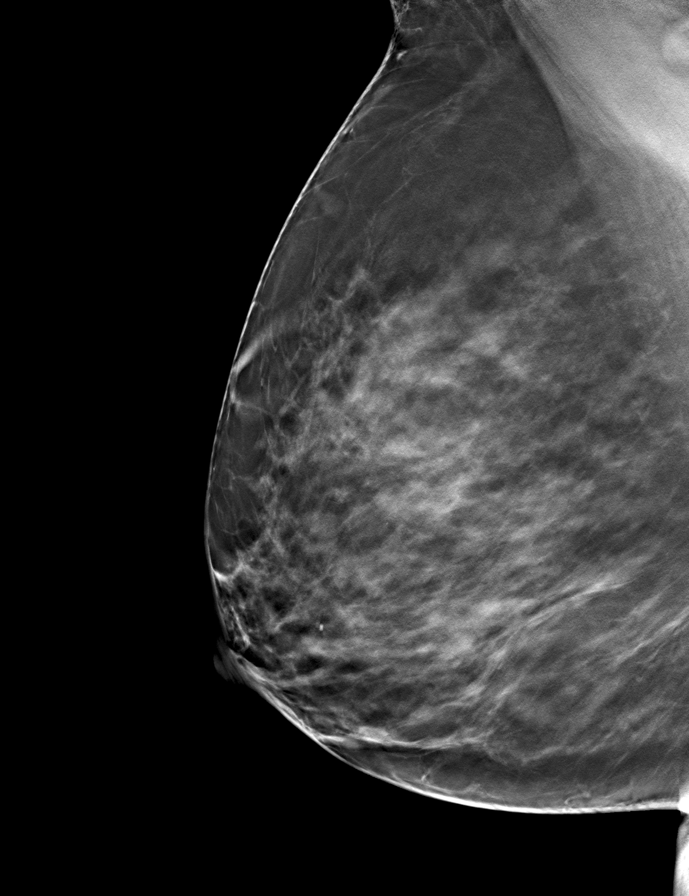

[9 of 25 positions shown; findings below may reference images not displayed]

FINDINGS: ACR Breast Density Category 3: The breast tissue is heterogeneously
dense.

No suspicious masses, architectural distortion, or calcifications
are present.

Images were processed with CAD.
IMPRESSION: No mammographic evidence of malignancy.

A result letter of this screening mammogram will be mailed directly
to the patient.

RECOMMENDATION:
Screening mammogram in one year. (Code:76-4-KYD)

BI-RADS CATEGORY 1:  Negative.

## 2013-09-13 ENCOUNTER — Other Ambulatory Visit: Payer: Managed Care, Other (non HMO)

## 2013-10-08 ENCOUNTER — Other Ambulatory Visit: Payer: Managed Care, Other (non HMO)

## 2013-10-09 ENCOUNTER — Other Ambulatory Visit: Payer: Managed Care, Other (non HMO)

## 2014-05-20 ENCOUNTER — Telehealth: Payer: Self-pay | Admitting: Cardiovascular Disease

## 2014-05-20 DIAGNOSIS — E876 Hypokalemia: Secondary | ICD-10-CM

## 2014-05-20 NOTE — Telephone Encounter (Signed)
New message     Pt forgot to get labs drawn earlier this year.  Do you still want her to come and have them drawn?

## 2014-05-20 NOTE — Telephone Encounter (Signed)
Calling stating she was suppose to get lab work done last January after increasing her K+ but she forgot and wants to know if she needs to come get lab.  States she is not taking the K+ every day.  Advised she was instructed to take 20 meq daily. Will come in Friday to get BMET. Order was placed last January.

## 2014-05-22 ENCOUNTER — Other Ambulatory Visit (INDEPENDENT_AMBULATORY_CARE_PROVIDER_SITE_OTHER): Payer: Managed Care, Other (non HMO) | Admitting: *Deleted

## 2014-05-22 DIAGNOSIS — I10 Essential (primary) hypertension: Secondary | ICD-10-CM

## 2014-05-22 LAB — BASIC METABOLIC PANEL
BUN: 14 mg/dL (ref 6–23)
CO2: 26 mEq/L (ref 19–32)
CREATININE: 0.8 mg/dL (ref 0.4–1.2)
Calcium: 9.6 mg/dL (ref 8.4–10.5)
Chloride: 105 mEq/L (ref 96–112)
GFR: 95.29 mL/min (ref >60.00)
Glucose, Bld: 108 mg/dL — ABNORMAL HIGH (ref 70–99)
Potassium: 3.7 mEq/L (ref 3.5–5.1)
Sodium: 137 mEq/L (ref 135–145)

## 2014-05-24 ENCOUNTER — Other Ambulatory Visit: Payer: Managed Care, Other (non HMO)

## 2014-07-03 ENCOUNTER — Other Ambulatory Visit: Payer: Self-pay | Admitting: Obstetrics and Gynecology

## 2014-07-09 LAB — CYTOLOGY - PAP

## 2014-07-10 ENCOUNTER — Encounter: Payer: Self-pay | Admitting: Cardiovascular Disease

## 2014-07-10 ENCOUNTER — Ambulatory Visit (INDEPENDENT_AMBULATORY_CARE_PROVIDER_SITE_OTHER): Payer: Managed Care, Other (non HMO) | Admitting: Cardiovascular Disease

## 2014-07-10 VITALS — BP 136/98 | HR 71 | Ht 62.5 in | Wt 180.8 lb

## 2014-07-10 DIAGNOSIS — Z83438 Family history of other disorder of lipoprotein metabolism and other lipidemia: Secondary | ICD-10-CM

## 2014-07-10 DIAGNOSIS — I10 Essential (primary) hypertension: Secondary | ICD-10-CM

## 2014-07-10 DIAGNOSIS — Z8349 Family history of other endocrine, nutritional and metabolic diseases: Secondary | ICD-10-CM

## 2014-07-10 NOTE — Assessment & Plan Note (Signed)
[name] is doing ok She did not take her meds today but typically her BP is well controlled.

## 2014-07-10 NOTE — Progress Notes (Signed)
[name] Date of Birth  1970/09/01 [name] [address]    [address] [place], [place]  [id number] [phone]  Fax  [phone]  History of Present Illness:  05/23/12:; 89 a female with a history of hypertension. She has done very well. She was started on labetalol. She tolerates this very well.  She's not had any episodes of chest pain or shortness of breath.  She has a cold now and is on Claritin -D.  She has not been exercising regularly - occasionally once a week.  [name] 20, 2015:  [name] is doing ok  Her father had a CVA and she is having to deal with this issue.    She has not been checking her BP.  She has been feeling well.    Dec. 30, 2015:  [name] is a 43 yo with hx of HTN .  BP is up a bit today because she has not taken her meds today.     Current Outpatient Prescriptions on File Prior to Visit  Medication Sig Dispense Refill  . hydrochlorothiazide (HYDRODIURIL) 25 MG tablet Take 1 tablet (25 mg total) by mouth daily. 90 tablet 3  . labetalol (NORMODYNE) 300 MG tablet Take 1 tablet (300 mg total) by mouth 2 (two) times daily. 180 tablet 3  . Multiple Vitamin (MULTIVITAMIN) capsule Take 1 capsule by mouth daily.      . potassium chloride (MICRO-K) 10 MEQ CR capsule Take 1 capsule (10 mEq total) by mouth 2 (two) times daily. 180 capsule 3   No current facility-administered medications on file prior to visit.    Allergies  Allergen Reactions  . Motrin [Ibuprofen] Swelling  . Penicillins Hives  . Sulfa Antibiotics Hives    Past Medical History  Diagnosis Date  . Hypertension   . Mitral valve prolapse     Past Surgical History  Procedure Laterality Date  . Breast lumpectomy    . Myomectomy      History  Smoking status  . Never Smoker   Smokeless tobacco  . Not on file    History  Alcohol Use: Not on file    Family History  Problem Relation Age of Onset  . Hypertension Mother   . Hyperlipidemia Mother   . Diabetes Mother    . Hypertension Father   . Hyperlipidemia Father   . Diabetes Father   . Heart attack Maternal Uncle   . Diabetes Maternal Grandmother   . Hyperlipidemia Maternal Grandmother   . Hypertension Maternal Grandmother   . Sudden death Neg Hx     Reviw of Systems:  Reviewed in the HPI.  All other systems are negative.  Physical Exam: BP 136/98 mmHg  Pulse 71  Ht 5' 2.5" (1.588 m)  Wt 180 lb 12.8 oz (82.01 kg)  BMI 32.52 kg/m2 The patient is alert and oriented x 3.  The mood and affect are normal.   Skin: warm and dry.  Color is normal.    HEENT:   the sclera are nonicteric.  The mucous membranes are moist.  The carotids are 2+ without bruits.  There is no thyromegaly.  There is no JVD.    Lungs: clear.  The chest wall is non tender.    Heart: regular rate with a normal S1 and S2.  There are no murmurs, gallops, or rubs. The PMI is not displaced.     Abdomen: good bowel sounds.  There is no guarding or rebound.  There is no hepatosplenomegaly or  tenderness.  There are no masses.   Extremities:  no clubbing, cyanosis, or edema.  The legs are without rashes.  The distal pulses are intact.   Neuro:  Cranial nerves II - XII are intact.  Motor and sensory functions are intact.    The gait is normal.  ECG:  Assessment / Plan:

## 2014-07-10 NOTE — Patient Instructions (Addendum)
Your physician wants you to follow-up in: 1 year with Dr. Elease HashimotoNahser and complete FASTING lab work a few days before or at appointment. You will receive a reminder letter in the mail two months in advance. If you don't receive a letter, please call our office to schedule the follow-up appointment.  Your physician recommends that you continue on your current medications as directed. Please refer to the Current Medication list given to you today.

## 2014-08-26 ENCOUNTER — Other Ambulatory Visit: Payer: Self-pay | Admitting: Cardiovascular Disease

## 2015-05-12 ENCOUNTER — Other Ambulatory Visit: Payer: Self-pay

## 2015-05-12 MED ORDER — LABETALOL HCL 300 MG PO TABS: ORAL_TABLET | ORAL | Status: DC

## 2015-05-12 MED ORDER — HYDROCHLOROTHIAZIDE 25 MG PO TABS: ORAL_TABLET | ORAL | Status: DC

## 2015-05-12 MED ORDER — POTASSIUM CHLORIDE ER 10 MEQ PO CPCR: ORAL_CAPSULE | ORAL | Status: DC

## 2015-06-18 ENCOUNTER — Other Ambulatory Visit (INDEPENDENT_AMBULATORY_CARE_PROVIDER_SITE_OTHER): Payer: Managed Care, Other (non HMO)

## 2015-06-18 DIAGNOSIS — I10 Essential (primary) hypertension: Secondary | ICD-10-CM

## 2015-06-18 NOTE — Addendum Note (Signed)
Addended by: Tonita PhoenixBOWDEN, Marise Knapper K on: 06/18/2015 08:30 AM   Modules accepted: Orders

## 2015-06-18 NOTE — Addendum Note (Signed)
Addended by: Tonita PhoenixBOWDEN, ROBIN K on: 06/18/2015 08:29 AM   Modules accepted: Orders

## 2015-06-20 ENCOUNTER — Other Ambulatory Visit (INDEPENDENT_AMBULATORY_CARE_PROVIDER_SITE_OTHER): Payer: Managed Care, Other (non HMO)

## 2015-06-20 ENCOUNTER — Encounter: Payer: Self-pay | Admitting: Cardiovascular Disease

## 2015-06-20 ENCOUNTER — Ambulatory Visit (INDEPENDENT_AMBULATORY_CARE_PROVIDER_SITE_OTHER): Payer: Managed Care, Other (non HMO) | Admitting: Cardiovascular Disease

## 2015-06-20 VITALS — BP 130/84 | HR 72 | Ht 62.5 in | Wt 181.1 lb

## 2015-06-20 DIAGNOSIS — E785 Hyperlipidemia, unspecified: Secondary | ICD-10-CM

## 2015-06-20 DIAGNOSIS — I10 Essential (primary) hypertension: Secondary | ICD-10-CM

## 2015-06-20 DIAGNOSIS — I119 Hypertensive heart disease without heart failure: Secondary | ICD-10-CM

## 2015-06-20 LAB — LIPID PANEL
CHOL/HDL RATIO: 4.3 ratio (ref <=5.0)
CHOLESTEROL: 202 mg/dL — AB (ref 125–200)
HDL: 47 mg/dL (ref >=46)
LDL Cholesterol: 118 mg/dL (ref <130)
TRIGLYCERIDES: 183 mg/dL — AB (ref <150)
VLDL: 37 mg/dL — AB (ref <30)

## 2015-06-20 LAB — HEPATIC FUNCTION PANEL
ALT: 19 U/L (ref 6–29)
AST: 24 U/L (ref 10–35)
Albumin: 4 g/dL (ref 3.6–5.1)
Alkaline Phosphatase: 48 U/L (ref 33–115)
BILIRUBIN DIRECT: 0.1 mg/dL (ref <=0.2)
BILIRUBIN INDIRECT: 0.3 mg/dL (ref 0.2–1.2)
TOTAL PROTEIN: 7.2 g/dL (ref 6.1–8.1)
Total Bilirubin: 0.4 mg/dL (ref 0.2–1.2)

## 2015-06-20 LAB — BASIC METABOLIC PANEL
BUN: 11 mg/dL (ref 7–25)
CO2: 24 mmol/L (ref 20–31)
Calcium: 9.1 mg/dL (ref 8.6–10.2)
Chloride: 104 mmol/L (ref 98–110)
Creat: 0.71 mg/dL (ref 0.50–1.10)
GLUCOSE: 101 mg/dL — AB (ref 65–99)
POTASSIUM: 3.6 mmol/L (ref 3.5–5.3)
SODIUM: 138 mmol/L (ref 135–146)

## 2015-06-20 NOTE — Patient Instructions (Signed)
Medication Instructions:  Your physician recommends that you continue on your current medications as directed. Please refer to the Current Medication list given to you today.   Labwork: TODAY - cholesterol, liver, basic metabolic panel   Testing/Procedures: None Ordered   Follow-Up: Your physician wants you to follow-up in: 1 year with Dr. Nahser.  You will receive a reminder letter in the mail two months in advance. If you don't receive a letter, please call our office to schedule the follow-up appointment.   If you need a refill on your cardiac medications before your next appointment, please call your pharmacy.   Thank you for choosing [facility]! Michelle Swinyer, RN [phone]   

## 2015-06-20 NOTE — Addendum Note (Signed)
Addended by: Channing MuttersGRACE, Aidon Klemens J on: 06/20/2015 02:14 PM   Modules accepted: Orders

## 2015-06-20 NOTE — Progress Notes (Signed)
[name] Date of Birth  09-Apr-1971 [facility] [address]    [address] [place], [place]  [address] [phone]  Fax  [phone]   Problem list 1. Essential hypertension History of Present Illness:  05/23/12:; 89 a female with a history of hypertension. She has done very well. She was started on labetalol. She tolerates this very well.  She's not had any episodes of chest pain or shortness of breath.  She has a cold now and is on Claritin -D.  She has not been exercising regularly - occasionally once a week.  [name] 20, 2015:  [name] is doing ok  Her father had a CVA and she is having to deal with this issue.    She has not been checking her BP.  She has been feeling well.    Dec. 30, 2015:  [name] is a 44 yo with hx of HTN .  BP is up a bit today because she has not taken her meds today.   Dec. 9, 2016: Doing ok.   Has not been working out as much .  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  Does not always remember to take the 2nd Labetalol tablet No CP or dyspnea.    Current Outpatient Prescriptions on File Prior to Visit  Medication Sig Dispense Refill  . hydrochlorothiazide (HYDRODIURIL) 25 MG tablet TAKE 1 TABLET (25 MG TOTAL) BY MOUTH DAILY. 90 tablet 0  . labetalol (NORMODYNE) 300 MG tablet TAKE 1 TABLET (300 MG TOTAL) BY MOUTH 2 (TWO) TIMES DAILY. 180 tablet 0  . Multiple Vitamin (MULTIVITAMIN) capsule Take 1 capsule by mouth daily.      . potassium chloride (MICRO-K) 10 MEQ CR capsule TAKE 1 CAPSULE (10 MEQ TOTAL) BY MOUTH 2 (TWO) TIMES DAILY. 180 capsule 0   No current facility-administered medications on file prior to visit.    Allergies  Allergen Reactions  . Motrin [Ibuprofen] Swelling  . Penicillins Hives  . Sulfa Antibiotics Hives    Past Medical History  Diagnosis Date  . Hypertension   . Mitral valve prolapse     Past  Surgical History  Procedure Laterality Date  . Breast lumpectomy    . Myomectomy      History  Smoking status  . Never Smoker   Smokeless tobacco  . Not on file    History  Alcohol Use: Not on file    Family History  Problem Relation Age of Onset  . Hypertension Mother   . Hyperlipidemia Mother   . Diabetes Mother   . Hypertension Father   . Hyperlipidemia Father   . Diabetes Father   . Heart attack Maternal Uncle   . Diabetes Maternal Grandmother   . Hyperlipidemia Maternal Grandmother   . Hypertension Maternal Grandmother   . Sudden death Neg Hx     Reviw of Systems:  Reviewed in the HPI.  All other systems are negative.  Physical Exam: BP 130/84 mmHg  Pulse 72  Ht 5' 2.5" (1.588 m)  Wt 181 lb 1.9 oz (82.155 kg)  BMI 32.58 kg/m2 The patient is alert and oriented x 3.  The mood and affect are normal.   Skin: warm and dry.  Color is normal.    HEENT:   the sclera are nonicteric.  The mucous membranes are moist.  The carotids are 2+ without bruits.  There is no thyromegaly.  There is no JVD.    Lungs: clear.  The chest wall is non tender.    Heart: regular rate with a normal S1 and S2.  There are no murmurs, gallops, or rubs. The PMI is not displaced.     Abdomen: good bowel sounds.  There is no guarding or rebound.  There is no hepatosplenomegaly or tenderness.  There are no masses.   Extremities:  no clubbing, cyanosis, or edema.  The legs are without rashes.  The distal pulses are intact.   Neuro:  Cranial nerves II - XII are intact.  Motor and sensory functions are intact.    The gait is normal.  ECG: NSR at 72.  LAD   ,  Low voltage QRS   Assessment / Plan:   1. Essential hypertension: [name] is doing great. We will continue with her same medications. I've encouraged her to work on a diet and exercise program. We'll check labs today including a basic medical profile, lipid profile, liver enzymes. I'll see her again in one year.  2. Hyperlipidemia:  - Her last lipid level was a little elevated. I've encouraged her to exercise and to eat low fat diet.    Kris No, Deloris PingPhilip J, MD  06/20/2015 2:34 PM    [facility] [address]  [address] [place], [place]  [address] Pager [phone] Phone: [phone]) [phone]; Fax: [phone]   [facility]  [address] [place], [place]  [address] [phone]   Fax [phone]

## 2015-07-02 ENCOUNTER — Other Ambulatory Visit: Payer: Self-pay

## 2015-07-02 DIAGNOSIS — Z1231 Encounter for screening mammogram for malignant neoplasm of breast: Secondary | ICD-10-CM

## 2015-08-21 ENCOUNTER — Other Ambulatory Visit: Payer: Self-pay | Admitting: Cardiovascular Disease

## 2015-09-06 ENCOUNTER — Other Ambulatory Visit: Payer: Self-pay | Admitting: Cardiovascular Disease

## 2016-02-23 ENCOUNTER — Encounter (HOSPITAL_BASED_OUTPATIENT_CLINIC_OR_DEPARTMENT_OTHER): Payer: Self-pay | Admitting: Emergency Medicine

## 2016-02-23 ENCOUNTER — Emergency Department (HOSPITAL_COMMUNITY): Payer: Managed Care, Other (non HMO)

## 2016-02-23 ENCOUNTER — Emergency Department (HOSPITAL_BASED_OUTPATIENT_CLINIC_OR_DEPARTMENT_OTHER)
Admission: EM | Admit: 2016-02-23 | Discharge: 2016-02-23 | Disposition: A | Discharge status: Home / Self Care | Payer: Managed Care, Other (non HMO) | Attending: Emergency Medicine | Admitting: Emergency Medicine

## 2016-02-23 DIAGNOSIS — R200 Anesthesia of skin: Secondary | ICD-10-CM | POA: Insufficient documentation

## 2016-02-23 DIAGNOSIS — Z79899 Other long term (current) drug therapy: Secondary | ICD-10-CM | POA: Insufficient documentation

## 2016-02-23 DIAGNOSIS — I10 Essential (primary) hypertension: Secondary | ICD-10-CM | POA: Diagnosis not present

## 2016-02-23 DIAGNOSIS — R42 Dizziness and giddiness: Secondary | ICD-10-CM | POA: Diagnosis not present

## 2016-02-23 LAB — RAPID URINE DRUG SCREEN, HOSP PERFORMED
Amphetamines: NOT DETECTED
BENZODIAZEPINES: NOT DETECTED
Barbiturates: NOT DETECTED
Cocaine: NOT DETECTED
Opiates: NOT DETECTED
Tetrahydrocannabinol: NOT DETECTED

## 2016-02-23 LAB — COMPREHENSIVE METABOLIC PANEL
ALBUMIN: 4.2 g/dL (ref 3.5–5.0)
ALK PHOS: 47 U/L (ref 38–126)
ALT: 25 U/L (ref 14–54)
ANION GAP: 10 (ref 5–15)
AST: 35 U/L (ref 15–41)
BILIRUBIN TOTAL: 0.7 mg/dL (ref 0.3–1.2)
BUN: 11 mg/dL (ref 6–20)
CALCIUM: 9.5 mg/dL (ref 8.9–10.3)
CO2: 25 mmol/L (ref 22–32)
Chloride: 98 mmol/L — ABNORMAL LOW (ref 101–111)
Creatinine, Ser: 0.69 mg/dL (ref 0.44–1.00)
GFR calc non Af Amer: >60 mL/min (ref >60)
GLUCOSE: 109 mg/dL — AB (ref 65–99)
Potassium: 3.5 mmol/L (ref 3.5–5.1)
Sodium: 133 mmol/L — ABNORMAL LOW (ref 135–145)
TOTAL PROTEIN: 7.7 g/dL (ref 6.5–8.1)

## 2016-02-23 LAB — PROTIME-INR
INR: 0.97
Prothrombin Time: 12.9 seconds (ref 11.4–15.2)

## 2016-02-23 LAB — CBC
HCT: 41.3 % (ref 36.0–46.0)
HEMOGLOBIN: 14.3 g/dL (ref 12.0–15.0)
MCH: 27.4 pg (ref 26.0–34.0)
MCHC: 34.6 g/dL (ref 30.0–36.0)
MCV: 79.3 fL (ref 78.0–100.0)
Platelets: 255 10*3/uL (ref 150–400)
RBC: 5.21 MIL/uL — AB (ref 3.87–5.11)
RDW: 13 % (ref 11.5–15.5)
WBC: 10.4 10*3/uL (ref 4.0–10.5)

## 2016-02-23 LAB — URINALYSIS, ROUTINE W REFLEX MICROSCOPIC
Bilirubin Urine: NEGATIVE
Glucose, UA: NEGATIVE mg/dL
HGB URINE DIPSTICK: NEGATIVE
Ketones, ur: NEGATIVE mg/dL
Nitrite: NEGATIVE
Protein, ur: NEGATIVE mg/dL
SPECIFIC GRAVITY, URINE: 1.016 (ref 1.005–1.030)
pH: 6 (ref 5.0–8.0)

## 2016-02-23 LAB — TROPONIN I

## 2016-02-23 LAB — URINE MICROSCOPIC-ADD ON: RBC / HPF: NONE SEEN RBC/hpf (ref 0–5)

## 2016-02-23 LAB — DIFFERENTIAL
Basophils Absolute: 0 10*3/uL (ref 0.0–0.1)
Basophils Relative: 0 %
EOS PCT: 3 %
Eosinophils Absolute: 0.3 10*3/uL (ref 0.0–0.7)
LYMPHS ABS: 1.5 10*3/uL (ref 0.7–4.0)
LYMPHS PCT: 15 %
MONO ABS: 0.7 10*3/uL (ref 0.1–1.0)
Monocytes Relative: 7 %
Neutro Abs: 7.9 10*3/uL — ABNORMAL HIGH (ref 1.7–7.7)
Neutrophils Relative %: 75 %

## 2016-02-23 LAB — APTT: aPTT: 29 seconds (ref 24–36)

## 2016-02-23 LAB — ETHANOL: Alcohol, Ethyl (B): <5 mg/dL (ref <5)

## 2016-02-23 MED ORDER — MECLIZINE HCL 25 MG PO TABS
25.0000 mg | ORAL_TABLET | Freq: Once | ORAL | Status: AC
  Administered 2016-02-23: 25 mg via ORAL
  Filled 2016-02-23: qty 1

## 2016-02-23 MED ORDER — MECLIZINE HCL 25 MG PO TABS: 25.0000 mg | ORAL_TABLET | Freq: Three times a day (TID) | ORAL | 0 refills | Status: AC | PRN

## 2016-02-23 MED ORDER — DIAZEPAM 5 MG PO TABS
5.0000 mg | ORAL_TABLET | Freq: Once | ORAL | Status: AC
  Administered 2016-02-23: 5 mg via ORAL
  Filled 2016-02-23: qty 1

## 2016-02-23 NOTE — ED Notes (Signed)
Patient transported to MRI 

## 2016-02-23 NOTE — ED Triage Notes (Signed)
Pt having dizziness and left sided facial numbness since 0645 this am.  No other symptoms noted.

## 2016-02-23 NOTE — ED Provider Notes (Signed)
[name] is an 45 y.o. female with history of HTN who presents to the [facility] as a transfer from [facility] for further evaluation of her dizziness. Prior notes/labs reviewed. Pt states she was in her usual state of health until this morning around 6:30 AM she woke up and felt the left side of her jaw and her left arm and first through third fingers were numb with pins and needles. More disconcerting for her, she states, was the associated dizziness and lightheadedness. She states she was very dizzy and nauseated. She states it felt like the room was spinning. She states the dizziness was much worse when laying flat or sitting straight up. It is also worse when she looks around too quickly. She states she was up from 2-5AM (normal for her) doing housework and when she went to sleep around 5 she was completely normal. She states nothing liket his has ever happened before. States she has a history of carpal tunnel but that resolved years ago, though the numbness in her left hand feels somewhat similar to numbness she has had before. She denies history of vertigo. Endorses drinking wine several times a week, otherwise denies tobacco or illicit drug use. Denies chest pain or SOB. Denies headache, blurry vision, facial droop, slurred speech, weakness.   Physical Exam  BP 146/100   Pulse 79   Temp 98.2 F (36.8 C) (Oral)   Resp 22   Ht 5\' 2"  (1.575 m)   Wt 80.7 kg   LMP 02/09/2016   SpO2 99%   BMI 32.56 kg/m   Physical Exam  Constitutional: She is oriented to person, place, and time.  HENT:  Head: Atraumatic.  Right Ear: External ear normal.  Left Ear: External ear normal.  Nose: Nose normal.  Mouth/Throat: Oropharynx is clear and moist. No oropharyngeal exudate.  Eyes: Conjunctivae and EOM are normal. Pupils are equal, round, and reactive to light.  No nystagmus Pt reports increased dizziness with rapid EOM  Neck: Normal range of motion. Neck supple.  Cardiovascular: Normal rate,  regular rhythm, normal heart sounds and intact distal pulses.   Pulmonary/Chest: Effort normal and breath sounds normal. No respiratory distress. She has no wheezes.  Abdominal: Soft. Bowel sounds are normal. She exhibits no distension. There is no tenderness. There is no rebound and no guarding.  Musculoskeletal: She exhibits no edema.  FROM of all extremities  Lymphadenopathy:    She has no cervical adenopathy.  Neurological: She is alert and oriented to person, place, and time. No cranial nerve deficit.  5/5 strength throughout Normal finger to nose No pronator drift  Skin: Skin is warm and dry.  Psychiatric: She has a normal mood and affect.  Nursing note and vitals reviewed.     ED Course  Procedures Results for orders placed or performed during the hospital encounter of 02/23/16  Ethanol  Result Value Ref Range   Alcohol, Ethyl (B) <5 <5 mg/dL  Protime-INR  Result Value Ref Range   Prothrombin Time 12.9 11.4 - 15.2 seconds   INR 0.97   APTT  Result Value Ref Range   aPTT 29 24 - 36 seconds  CBC  Result Value Ref Range   WBC 10.4 4.0 - 10.5 K/uL   RBC 5.21 (H) 3.87 - 5.11 MIL/uL   Hemoglobin 14.3 12.0 - 15.0 g/dL   HCT 16.1 09.6 - 04.5 %   MCV 79.3 78.0 - 100.0 fL   MCH 27.4 26.0 - 34.0 pg  MCHC 34.6 30.0 - 36.0 g/dL   RDW 44.013.0 34.711.5 - 42.515.5 %   Platelets 255 150 - 400 K/uL  Differential  Result Value Ref Range   Neutrophils Relative % 75 %   Neutro Abs 7.9 (H) 1.7 - 7.7 K/uL   Lymphocytes Relative 15 %   Lymphs Abs 1.5 0.7 - 4.0 K/uL   Monocytes Relative 7 %   Monocytes Absolute 0.7 0.1 - 1.0 K/uL   Eosinophils Relative 3 %   Eosinophils Absolute 0.3 0.0 - 0.7 K/uL   Basophils Relative 0 %   Basophils Absolute 0.0 0.0 - 0.1 K/uL  Comprehensive metabolic panel  Result Value Ref Range   Sodium 133 (L) 135 - 145 mmol/L   Potassium 3.5 3.5 - 5.1 mmol/L   Chloride 98 (L) 101 - 111 mmol/L   CO2 25 22 - 32 mmol/L   Glucose, Bld 109 (H) 65 - 99 mg/dL   BUN 11  6 - 20 mg/dL   Creatinine, Ser 9.560.69 0.44 - 1.00 mg/dL   Calcium 9.5 8.9 - 38.710.3 mg/dL   Total Protein 7.7 6.5 - 8.1 g/dL   Albumin 4.2 3.5 - 5.0 g/dL   AST 35 15 - 41 U/L   ALT 25 14 - 54 U/L   Alkaline Phosphatase 47 38 - 126 U/L   Total Bilirubin 0.7 0.3 - 1.2 mg/dL   GFR calc non Af Amer >60 >60 mL/min   GFR calc Af Amer >60 >60 mL/min   Anion gap 10 5 - 15  Urine rapid drug screen (hosp performed)not at [facility]  Result Value Ref Range   Opiates NONE DETECTED NONE DETECTED   Cocaine NONE DETECTED NONE DETECTED   Benzodiazepines NONE DETECTED NONE DETECTED   Amphetamines NONE DETECTED NONE DETECTED   Tetrahydrocannabinol NONE DETECTED NONE DETECTED   Barbiturates NONE DETECTED NONE DETECTED  Urinalysis, Routine w reflex microscopic (not at [facility])  Result Value Ref Range   Color, Urine YELLOW YELLOW   APPearance CLEAR CLEAR   Specific Gravity, Urine 1.016 1.005 - 1.030   pH 6.0 5.0 - 8.0   Glucose, UA NEGATIVE NEGATIVE mg/dL   Hgb urine dipstick NEGATIVE NEGATIVE   Bilirubin Urine NEGATIVE NEGATIVE   Ketones, ur NEGATIVE NEGATIVE mg/dL   Protein, ur NEGATIVE NEGATIVE mg/dL   Nitrite NEGATIVE NEGATIVE   Leukocytes, UA TRACE (A) NEGATIVE  Troponin I  Result Value Ref Range   Troponin I <0.03 <0.03 ng/mL  Urine microscopic-add on  Result Value Ref Range   Squamous Epithelial / LPF 0-5 (A) NONE SEEN   WBC, UA 6-30 0 - 5 WBC/hpf   RBC / HPF NONE SEEN 0 - 5 RBC/hpf   Bacteria, UA MANY (A) NONE SEEN   Urine-Other MUCOUS PRESENT    Ct Head Wo Contrast  Result Date: 02/23/2016 CLINICAL DATA:  Left side facial numbness, dizziness EXAM: CT HEAD WITHOUT CONTRAST TECHNIQUE: Contiguous axial images were obtained from the base of the skull through the vertex without intravenous contrast. COMPARISON:  None. FINDINGS: No skull fracture is noted. Paranasal sinuses and mastoid air cells are unremarkable. No intracranial hemorrhage, mass effect or midline shift. No acute cortical infarction. No  mass lesion is noted on this unenhanced scan. No intra or extra-axial fluid collection. No hydrocephalus. The gray and white-matter differentiation is preserved. IMPRESSION: No acute intracranial abnormality. No definite acute cortical infarction. Electronically Signed   By: Natasha MeadLiviu  Pop M.D.   On: 02/23/2016 15:04      MDM Pt  with some improvement after she was [facility]. She still feels dizzy now in the ED with EOM exercises and with postural change in the bed. Will trial dose of meclizine. Prior team had spoken with neurology who [facility] for full stroke work[facility]. We will obtain MRI.   3:06 PM I spoke with Dr. Amada JupiterKirkpatrick of neurology regarding pt's case. Proceed with plans for MRI. If MRI is negative and pt feeling improved, can ambulate okay we can plan for discharge home with outpatient neurology follow up.     Carlene CoriaSerena Y Lizzie An, PA-C 02/23/16 1524    Gerhard Munchobert Lockwood, MD 02/23/16 [phone]

## 2016-02-23 NOTE — ED Notes (Signed)
Dr. Juleen ChinaKohut [facility]

## 2016-02-23 NOTE — ED Provider Notes (Signed)
Signout received from [place].  Please refer to their note for full history, physical exam, and original plan.  In brief, [name] is a 45 y.o. female with dizziness this morning, worse with lying flat or sitting up or movement, better with rest.  Also mentioned some left jaw numbness and paresthesias radiating to left hand.  Neuro exam otherwise intact.  Dizziness is reproducible.  Given valium + meclizine.  If MRI is normal, can go with meclizine and neuro followup.    Pending: MRI  MDM: On reassessment the patient's neuro exam and was intact.   Neurologic Exam Alert and oriented CN II-XII grossly intact Eyes: PERRL, EOMI Bilateral UE strength 5/5 Bilateral LE strength 5/5 Intact gait  Symptoms are improved. MRI showed no acute findings.  Meclizine at home, if not improving can f/u.  We have discussed the discharge plan, including the plan for outpatient followup, and strict return precautions, including those that would require calling 911.     Patient discussed with Dr. Ethelda ChickJacubowitz, who voiced agreement with plan.    Marcelina MorelMichael Elliott Lasecki, MD 02/23/16 Elfredia Nevins1812    Doug SouSam Jacubowitz, MD 02/24/16 [phone]

## 2016-02-23 NOTE — ED Notes (Signed)
Patient transported to CT 

## 2016-02-23 NOTE — ED Notes (Signed)
[facility]

## 2016-02-23 NOTE — ED Notes (Signed)
Pt states dizziness is resolved right now, but comes back when she sits up

## 2016-02-23 NOTE — ED Triage Notes (Signed)
Per EMS: [facility] woke up at 5am normal and then again at 630 with numbness to left side of face; pt sent here for CT

## 2016-02-23 NOTE — ED Provider Notes (Signed)
[facility] Provider Note   CSN: [id number] Arrival date & time: 02/23/16  1020     History   Chief Complaint Chief Complaint  Patient presents with  . Dizziness    HPI [name] is a 45 y.o. female.  HPI [name] is a 45 y.o. female with history of hypertension and mitral valve prolapse, presents to emergency department complaining of dizziness, numbness sensation to the left face and left fingertips. Patient states she was awake at night and went to bed at 5 AM. She states that is normal for her. She states she woke up at 6:30 this morning he noticed that when she moved or try to walk everything around her was spinning. She states she laid back down and tried to get up again and experienced the same symptoms. She reports associated nausea, and dizziness. Denies headache. Denies vomiting. States numbness in the left hand and face has improved. States continues to have some dizziness although that is improving as well. Last known normal at 5 AM. History of vertigo. Denies any nasal congestion, ear pain, tinnitus. She does mention that there is mold at her house and she is allergic to mold.  Past Medical History:  Diagnosis Date  . Hypertension   . Mitral valve prolapse     Patient Active Problem List   Diagnosis Date Noted  . Low back pain 06/02/2011  . HTN (hypertension) 10/12/2010    Past Surgical History:  Procedure Laterality Date  . BREAST LUMPECTOMY    . MYOMECTOMY      OB History    No data available       Home Medications    Prior to Admission medications   Medication Sig Start Date End Date Taking? Authorizing Provider  hydrochlorothiazide (HYDRODIURIL) 25 MG tablet TAKE 1 TABLET BY MOUTH DAILY 08/21/15   Vesta MixerPhilip J Nahser, MD  labetalol (NORMODYNE) 300 MG tablet TAKE 1 TABLET BY MOUTH TWICE A DAY 09/08/15   Vesta MixerPhilip J Nahser, MD  Multiple Vitamin (MULTIVITAMIN) capsule Take 1 capsule by mouth daily.      Historical Provider, MD    potassium chloride (MICRO-K) 10 MEQ CR capsule TAKE ONE CAPSULE BY MOUTH TWICE A DAY 09/08/15   Vesta MixerPhilip J Nahser, MD    Family History Family History  Problem Relation Age of Onset  . Hypertension Mother   . Hyperlipidemia Mother   . Diabetes Mother   . Hypertension Father   . Hyperlipidemia Father   . Diabetes Father   . Heart attack Maternal Uncle   . Diabetes Maternal Grandmother   . Hyperlipidemia Maternal Grandmother   . Hypertension Maternal Grandmother   . Sudden death Neg Hx     Social History Social History  Substance Use Topics  . Smoking status: Never Smoker  . Smokeless tobacco: Never Used  . Alcohol use Not on file     Allergies   Motrin [ibuprofen]; Penicillins; and Sulfa antibiotics   Review of Systems Review of Systems  Constitutional: Negative for chills and fever.  Respiratory: Negative for cough, chest tightness and shortness of breath.   Cardiovascular: Negative for chest pain, palpitations and leg swelling.  Gastrointestinal: Negative for abdominal pain, diarrhea, nausea and vomiting.  Genitourinary: Negative for dysuria, flank pain and pelvic pain.  Musculoskeletal: Negative for arthralgias, myalgias, neck pain and neck stiffness.  Skin: Negative for rash.  Neurological: Positive for dizziness and numbness. Negative for facial asymmetry, speech difficulty, weakness and headaches.  All other systems reviewed  and are negative.    Physical Exam Updated Vital Signs BP (!) 161/108   Pulse 78   Temp 98.5 F (36.9 C) (Oral)   Resp 16   Ht 5\' 2"  (1.575 m)   Wt 80.7 kg   LMP 02/09/2016   SpO2 99%   BMI 32.56 kg/m   Physical Exam  Constitutional: She appears well-developed and well-nourished. No distress.  HENT:  Head: Normocephalic.  Eyes: Conjunctivae are normal.  Neck: Neck supple.  Cardiovascular: Normal rate, regular rhythm and normal heart sounds.   Pulmonary/Chest: Effort normal and breath sounds normal. No respiratory distress.  She has no wheezes. She has no rales.  Abdominal: Soft. Bowel sounds are normal. She exhibits no distension. There is no tenderness. There is no rebound.  Musculoskeletal: She exhibits no edema.  Neurological: She is alert. No cranial nerve deficit. Coordination normal.  5/5 and equal upper and lower extremity strength bilaterally. Equal grip strength bilaterally. Normal finger to nose and heel to shin. No pronator drift. Patellar reflexes 2+.   Skin: Skin is warm and dry. Capillary refill takes less than 2 seconds.  Psychiatric: She has a normal mood and affect. Her behavior is normal.  Nursing note and vitals reviewed.    ED Treatments / Results  Labs (all labs ordered are listed, but only abnormal results are displayed) Labs Reviewed  ETHANOL  PROTIME-INR  APTT  CBC  DIFFERENTIAL  COMPREHENSIVE METABOLIC PANEL  URINE RAPID DRUG SCREEN, HOSP PERFORMED  URINALYSIS, ROUTINE W REFLEX MICROSCOPIC (NOT AT [facility])    EKG  EKG Interpretation None       Radiology No results found.  Procedures Procedures (including critical care time)  Medications Ordered in ED Medications  diazepam (VALIUM) tablet 5 mg (not administered)     Initial Impression / Assessment and Plan / ED Course  I have reviewed the triage vital signs and the nursing notes.  Pertinent labs & imaging results that were available during my care of the patient were reviewed by me and considered in my medical decision making (see chart for details).  Clinical Course  Comment By Time  Pt with acute onset of vertigo, no hx of the same. Normal neurological exam. Dr. Karma GanjaLinker discussed with neurology, recommended stroke work up. [facility]. Valium ordered.  Spoke with Dr. Juleen ChinaKohut will transfer Karen Crumbleatyana Rayaan Lorah, PA-C 08/14 1120   Patient with acute onset of vertigo symptoms and tingling in the left face and left hand when she woke up at 6:30 this morning. Last seen normal at 5 AM.  Initially made code stroke, however after speaking with Dr. Amada JupiterKirkpatrick, code stroke was canceled. [facility] of vertigo and stroke workup. Pt agrees.    Final Clinical Impressions(s) / ED Diagnoses   Final diagnoses:  Vertigo    New Prescriptions New Prescriptions   No medications on file     Karen Crumbleatyana Cosimo Schertzer, PA-C 02/24/16 2019    Jerelyn ScottMartha Linker, MD 02/25/16 [phone]

## 2016-02-23 NOTE — ED Notes (Signed)
[facility]--spoke with Thomas--returnig call to Dr. Karma GanjaLinker

## 2016-02-25 ENCOUNTER — Encounter (HOSPITAL_BASED_OUTPATIENT_CLINIC_OR_DEPARTMENT_OTHER): Payer: Self-pay

## 2016-04-02 ENCOUNTER — Telehealth: Payer: Self-pay | Admitting: Cardiovascular Disease

## 2016-04-02 NOTE — Telephone Encounter (Signed)
Spoke with patient 9/21 she was asking for [employer] Form to be completed she faxed this over to me.  Dr.Nasher has completed I left message with patient for return call-Form ready for pick up.

## 2016-10-21 ENCOUNTER — Other Ambulatory Visit: Payer: Self-pay | Admitting: Cardiovascular Disease

## 2016-11-25 ENCOUNTER — Other Ambulatory Visit: Payer: Self-pay | Admitting: Cardiovascular Disease

## 2017-01-11 ENCOUNTER — Other Ambulatory Visit: Payer: Self-pay | Admitting: Cardiovascular Disease

## 2017-06-14 ENCOUNTER — Ambulatory Visit: Payer: Managed Care, Other (non HMO) | Admitting: Cardiology

## 2017-06-17 ENCOUNTER — Encounter: Payer: Self-pay | Admitting: Cardiology

## 2017-06-30 NOTE — Progress Notes (Signed)
Cardiology Office Note   Date:  07/01/2017   ID:  [name], DOB [birth year], MRN [id number]  PCP:  Merri BrunetteSmith, Candace, MD  Cardiologist:  Dr. Elease HashimotoNahser    Chief Complaint  Patient presents with  . Hypertension      History of Present Illness: [name] is a 46 y.o. female who presents for HTN and HLD  Last seen 06/2015.   Today no chest pain, some SOB related to allergies now has pro air from PCP with resolution.  Is making attempts to eat healthier.  Had been exercising but has backed off with holidays.  Encouraged to resume.  Is going through a divorce.   She was seen at oral surgeon and BP was 170/118 and she was having headaches but she was not taking meds correctly. She has been taking twice a day now and BP is controlled.  She needs refills.  No palpitations or chest pain.    Past Medical History:  Diagnosis Date  . Hypertension   . Mitral valve prolapse     Past Surgical History:  Procedure Laterality Date  . BREAST LUMPECTOMY    . MYOMECTOMY       Current Outpatient Medications  Medication Sig Dispense Refill  . cetirizine (ZYRTEC) 10 MG tablet Take 10 mg by mouth daily as needed.     . hydrochlorothiazide (HYDRODIURIL) 25 MG tablet Take 1 tablet (25 mg total) by mouth daily. 90 tablet 3  . labetalol (NORMODYNE) 300 MG tablet Take 1 tablet (300 mg total) by mouth 2 (two) times daily. 180 tablet 3  . Multiple Vitamin (MULTIVITAMIN) capsule Take 1 capsule by mouth daily.      . potassium chloride (MICRO-K) 10 MEQ CR capsule Take 1 capsule (10 mEq total) by mouth 2 (two) times daily. 180 capsule 3   No current facility-administered medications for this visit.     Allergies:   Motrin [ibuprofen]; Other; Penicillins; and Sulfa antibiotics    Social History:  The patient  reports that  has never smoked. she has never used smokeless tobacco. She reports that she does not use drugs.   Family History:  The patient's family history includes Diabetes in her  father, maternal grandmother, and mother; Heart attack in her maternal uncle; Hyperlipidemia in her father, maternal grandmother, and mother; Hypertension in her father, maternal grandmother, and mother.    ROS:  General:no colds or fevers, no weight changes Skin:no rashes or ulcers HEENT:no blurred vision, no congestion CV:see HPI PUL:see HPI GI:no diarrhea constipation or melena, no indigestion GU:no hematuria, no dysuria MS:no joint pain, no claudication Neuro:no syncope, no lightheadedness Endo:no diabetes, no thyroid disease  Wt Readings from Last 3 Encounters:  07/01/17 174 lb 6.4 oz (79.1 kg)  02/23/16 178 lb (80.7 kg)  06/20/15 181 lb 1.9 oz (82.2 kg)     PHYSICAL EXAM: VS:  BP 122/86   Pulse 93   Ht 5\' 3"  (1.6 m)   Wt 174 lb 6.4 oz (79.1 kg)   LMP 06/07/2017   SpO2 97%   BMI 30.89 kg/m  , BMI Body mass index is 30.89 kg/m. General:Pleasant affect, NAD Skin:Warm and dry, brisk capillary refill HEENT:normocephalic, sclera clear, mucus membranes moist Neck:supple, no JVD, no bruits  Heart:S1S2 RRR without murmur, gallup, rub or click Lungs:clear without rales, rhonchi, or wheezes WGN:FAOZAbd:soft, non tender, + BS, do not palpate liver spleen or masses Ext:no lower ext edema, 2+ pedal pulses, 2+ radial pulses Neuro:alert and oriented, MAE, follows commands, +  facial symmetry    EKG:  EKG is ordered today. 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    Lipid Panel    Component Value Date/Time   CHOL 202 (H) 06/20/2015 1414   TRIG 183 (H) 06/20/2015 1414   HDL 47 06/20/2015 1414   CHOLHDL 4.3 06/20/2015 1414   VLDL 37 (H) 06/20/2015 1414   LDLCALC 118 06/20/2015 1414       Other studies Reviewed: Additional studies/ records that were reviewed today include:  Echo 2006 with mild MVP and mild LVH, normal EF.   ASSESSMENT AND PLAN:  1.  HTN better now she takes as  prescribed.  Refill meds.check lipids and CMP  follow up 1 year with Dr. Elease HashimotoNahser  2.  MVP no palpitations.   Current medicines are reviewed with the patient today.  The patient Has no concerns regarding medicines.  The following changes have been made:  See above Labs/ tests ordered today include:see above  Disposition:   FU:  see above  Signed, Nada BoozerLaura Noris Kulinski, NP  07/01/2017 5:57 PM    [facility] [address], [place], [place]  [address]/ [address] [place], [place] Phone: [phone]; Fax: [phone]  [phone]

## 2017-07-01 ENCOUNTER — Ambulatory Visit (INDEPENDENT_AMBULATORY_CARE_PROVIDER_SITE_OTHER): Payer: 59 | Admitting: Cardiology

## 2017-07-01 ENCOUNTER — Encounter (INDEPENDENT_AMBULATORY_CARE_PROVIDER_SITE_OTHER): Payer: Self-pay

## 2017-07-01 ENCOUNTER — Encounter: Payer: Self-pay | Admitting: Cardiology

## 2017-07-01 VITALS — BP 122/86 | HR 93 | Ht 63.0 in | Wt 174.4 lb

## 2017-07-01 DIAGNOSIS — I10 Essential (primary) hypertension: Secondary | ICD-10-CM

## 2017-07-01 DIAGNOSIS — E782 Mixed hyperlipidemia: Secondary | ICD-10-CM | POA: Diagnosis not present

## 2017-07-01 MED ORDER — HYDROCHLOROTHIAZIDE 25 MG PO TABS: 25.0000 mg | ORAL_TABLET | Freq: Every day | ORAL | 3 refills | Status: DC

## 2017-07-01 MED ORDER — POTASSIUM CHLORIDE ER 10 MEQ PO CPCR: 10.0000 meq | ORAL_CAPSULE | Freq: Two times a day (BID) | ORAL | 3 refills | Status: DC

## 2017-07-01 MED ORDER — LABETALOL HCL 300 MG PO TABS: 300.0000 mg | ORAL_TABLET | Freq: Two times a day (BID) | ORAL | 3 refills | Status: DC

## 2017-07-01 NOTE — Patient Instructions (Addendum)
Medication Instructions:  Your physician recommends that you continue on your current medications as directed. Please refer to the Current Medication list given to you today.   Labwork: 1 WEEK:  FASTING LIPID & CMP  Testing/Procedures: None ordered  Follow-Up: Your physician wants you to follow-up in: 1 YEAR WITH DR. Elease HashimotoNAHSER   You will receive a reminder letter in the mail two months in advance. If you don't receive a letter, please call our office to schedule the follow-up appointment.    Any Other Special Instructions Will Be Listed Below (If Applicable).     If you need a refill on your cardiac medications before your next appointment, please call your pharmacy.

## 2017-07-07 ENCOUNTER — Other Ambulatory Visit: Payer: 59

## 2017-07-14 ENCOUNTER — Other Ambulatory Visit: Payer: 59

## 2017-07-19 ENCOUNTER — Other Ambulatory Visit: Payer: 59

## 2018-04-06 ENCOUNTER — Encounter: Payer: Self-pay | Admitting: Family Medicine

## 2018-04-06 ENCOUNTER — Ambulatory Visit: Payer: 59 | Admitting: Family Medicine

## 2018-04-06 VITALS — BP 137/95 | HR 75 | Ht 63.0 in | Wt 178.0 lb

## 2018-04-06 DIAGNOSIS — M25561 Pain in right knee: Secondary | ICD-10-CM

## 2018-04-06 NOTE — Patient Instructions (Signed)
You have evidence of mild arthritis, patellofemoral syndrome, and possible small medial meniscus tear. Avoid deep squats, lunges, leg press for now. Start physical therapy and do home exercises on days you don't go to therapy. These are the different medications you can take for this only if needed: Tylenol 500mg  1-2 tabs three times a day for pain. Capsaicin, aspercreme, or biofreeze topically up to four times a day may also help with pain. Some supplements that may help for arthritis: Boswellia extract, curcumin, pycnogenol Aleve 1-2 tabs twice a day with food Cortisone injections are an option. It's important that you continue to stay active. Straight leg raises, knee extensions, straight leg raises with foot turned outwards 3 sets of 10 once a day (add ankle weight if these become too easy). Shoe inserts with good arch support may be helpful. Heat or ice 15 minutes at a time 3-4 times a day as needed to help with pain. Follow up with me in 5-6 weeks.

## 2018-04-07 ENCOUNTER — Encounter: Payer: Self-pay | Admitting: Family Medicine

## 2018-04-07 NOTE — Progress Notes (Signed)
PCP: Merri Brunette, MD  Subjective:   HPI: Patient is a 47 y.o. female here for right knee pain.  Patient reports she's had pain in right knee for about a year but worse past 2.5 months. Told remotely she had tendinitis of this knee but was also given a cortisone injection, brace. Continues to have pain anteromedial right knee at 2/10 level, up to 8/10 and sharp with walking at times. Could wake her up at night. Giving out at times on stairs and on treadmill - most recently 2 weeks ago. Tried icing and rest which have helped some. No skin changes, numbness.  Past Medical History:  Diagnosis Date  . Hypertension   . Mitral valve prolapse     Current Outpatient Medications on File Prior to Visit  Medication Sig Dispense Refill  . albuterol (PROVENTIL HFA;VENTOLIN HFA) 108 (90 Base) MCG/ACT inhaler     . cetirizine (ZYRTEC) 10 MG tablet Take 10 mg by mouth daily as needed.     . flunisolide (NASALIDE) 25 MCG/ACT (0.025%) SOLN SPRAY 2 SPRAYS INTO EACH NOSTRIL TWICE A DAY  5  . hydrochlorothiazide (HYDRODIURIL) 25 MG tablet Take 1 tablet (25 mg total) by mouth daily. 90 tablet 3  . hyoscyamine (LEVSIN SL) 0.125 MG SL tablet PLACE 1 TABLET UNDER THE TONGUE AND ALLOW TO DISSOLVE AS NEEDED EVERY 4 HRS IF NEEDED  0  . labetalol (NORMODYNE) 300 MG tablet Take 1 tablet (300 mg total) by mouth 2 (two) times daily. 180 tablet 3  . Multiple Vitamin (MULTIVITAMIN) capsule Take 1 capsule by mouth daily.      . potassium chloride (MICRO-K) 10 MEQ CR capsule Take 1 capsule (10 mEq total) by mouth 2 (two) times daily. 180 capsule 3  . VIBERZI 100 MG TABS Take 1 tablet by mouth 2 (two) times daily with a meal.  1   No current facility-administered medications on file prior to visit.     Past Surgical History:  Procedure Laterality Date  . BREAST LUMPECTOMY    . MYOMECTOMY      Allergies  Allergen Reactions  . Motrin [Ibuprofen] Swelling  . Other Other (See Comments)    Mold,cat & dog hair,  raw carrots,raw apples, burch &mapple trees, and ragweed  . Penicillins Hives  . Sulfa Antibiotics Hives    Social History   Socioeconomic History  . Marital status: Married    Spouse name: Not on file  . Number of children: Not on file  . Years of education: Not on file  . Highest education level: Not on file  Occupational History  . Not on file  Social Needs  . Financial resource strain: Not on file  . Food insecurity:    Worry: Not on file    Inability: Not on file  . Transportation needs:    Medical: Not on file    Non-medical: Not on file  Tobacco Use  . Smoking status: Never Smoker  . Smokeless tobacco: Never Used  Substance and Sexual Activity  . Alcohol use: Not on file  . Drug use: No  . Sexual activity: Not on file  Lifestyle  . Physical activity:    Days per week: Not on file    Minutes per session: Not on file  . Stress: Not on file  Relationships  . Social connections:    Talks on phone: Not on file    Gets together: Not on file    Attends religious service: Not on file  Active member of club or organization: Not on file    Attends meetings of clubs or organizations: Not on file    Relationship status: Not on file  . Intimate partner violence:    Fear of current or ex partner: Not on file    Emotionally abused: Not on file    Physically abused: Not on file    Forced sexual activity: Not on file  Other Topics Concern  . Not on file  Social History Narrative  . Not on file    Family History  Problem Relation Age of Onset  . Hypertension Mother   . Hyperlipidemia Mother   . Diabetes Mother   . Hypertension Father   . Hyperlipidemia Father   . Diabetes Father   . Heart attack Maternal Uncle   . Diabetes Maternal Grandmother   . Hyperlipidemia Maternal Grandmother   . Hypertension Maternal Grandmother   . Sudden death Neg Hx     BP (!) 137/95   Pulse 75   Ht 5\' 3"  (1.6 m)   Wt 178 lb (80.7 kg)   BMI 31.53 kg/m   Review of  Systems: See HPI above.     Objective:  Physical Exam:  Gen: NAD, comfortable in exam room  Right knee: No gross deformity, ecchymoses, effusion.  VMO atrophy. TTP medial joint line.  No other tenderness. FROM with 5/5 strength flexion and extension - patellar shift with flexion to extension but no pain. Negative ant/post drawers. Negative valgus/varus testing. Negative lachmans. Negative mcmurrays, apleys, patellar apprehension.  Positive thessalys. NV intact distally.  Left knee: No deformity. FROM with 5/5 strength. No tenderness to palpation. NVI distally.   Assessment & Plan:  1. Right knee pain - consistent with mild arthritis and possible small medial meniscus tear but has underlying patellofemoral syndrome as well.  Start physical therapy.  Discussed tylenol, topical medications, supplements that may help, aleve.  Consider intraarticular injection.  Avoid deep squats, lunges, leg press.  Heat or ice.  F/u in 5-6 weeks.

## 2018-04-17 ENCOUNTER — Ambulatory Visit: Payer: 59 | Attending: Family Medicine | Admitting: Physical Therapy

## 2018-04-17 ENCOUNTER — Ambulatory Visit: Payer: 59 | Admitting: Physical Therapy

## 2018-04-17 ENCOUNTER — Encounter: Payer: Self-pay | Admitting: Physical Therapy

## 2018-04-17 DIAGNOSIS — M25561 Pain in right knee: Secondary | ICD-10-CM | POA: Diagnosis present

## 2018-04-17 DIAGNOSIS — R29898 Other symptoms and signs involving the musculoskeletal system: Secondary | ICD-10-CM | POA: Insufficient documentation

## 2018-04-17 DIAGNOSIS — M6281 Muscle weakness (generalized): Secondary | ICD-10-CM

## 2018-04-17 DIAGNOSIS — G8929 Other chronic pain: Secondary | ICD-10-CM

## 2018-04-17 DIAGNOSIS — M25661 Stiffness of right knee, not elsewhere classified: Secondary | ICD-10-CM | POA: Insufficient documentation

## 2018-04-17 NOTE — Therapy (Addendum)
[facility] [address]  [place], [place], [address] Phone: [phone]   Fax:  [phone]  Physical Therapy Evaluation  Patient Details  Name: [name] MRN: [id number] Date of Birth: 10/25/1970 Referring Provider (PT): Karlton Lemon, MD   Encounter Date: 04/17/2018  PT End of Session - 04/17/18 1803    Visit Number  1    Number of Visits  13    Date for PT Re-Evaluation  05/29/18    Authorization Type  [employer]    PT Start Time  1448    PT Stop Time  1531    PT Time Calculation (min)  43 min    Activity Tolerance  Patient tolerated treatment well;Patient limited by pain    Behavior During Therapy  [facility] for tasks assessed/performed       Past Medical History:  Diagnosis Date  . Hypertension   . Mitral valve prolapse     Past Surgical History:  Procedure Laterality Date  . BREAST LUMPECTOMY    . MYOMECTOMY      There were no vitals filed for this visit.   Subjective Assessment - 04/17/18 1449    Subjective  Patient reports that R knee pain started in April or May of 2019. Pain gradually got worse from then on. Reports pain may have been exacerbated by starting to work out more in June. Has been doing strength training and resistance training with personal trainer. Patient with previous episode of this type of pain about 2 years ago- diagnosed with tendonitis.  Pain starts in R inferomedial knee. Cannot clearly identify aggravating factors- sometimes it will hurt with ADLs, walking inclines with treadmill, walking on uneven surfaces, sometimes with stairs. Had 2 episodes of knee buckling on the treadmill and has discontinued treadmill since then.    Pertinent History  mitral valve prolapse, HTN    Limitations  Standing;Walking;House hold activities   gym   How long can you sit comfortably?  unlimited    How long can you stand comfortably?  unlimited    How long can you walk comfortably?  15-30 min;  but variable    Diagnostic tests  none    Patient Stated Goals  get back on treadmill, complete strength training (45 min), walk 30-45 min    Currently in Pain?  Yes    Pain Location  Knee    Pain Orientation  Right    Pain Descriptors / Indicators  Sharp    Pain Type  Chronic pain         [facility] PT Assessment - 04/17/18 1505      Assessment   Medical Diagnosis  Acute pain of R knee    Referring Provider (PT)  Karlton Lemon, MD    Onset Date/Surgical Date  --   April or May 2019   Next MD Visit  05/18/18    Prior Therapy  Yes- for back      Precautions   Precautions  None      Restrictions   Weight Bearing Restrictions  No      Balance Screen   Has the patient fallen in the past 6 months  No    Has the patient had a decrease in activity level because of a fear of falling?   No    Is the patient reluctant to leave their home because of a fear of falling?   No      Home Environment  Living Environment  Private residence    Type of [place]   townhome   Home Access  Level entry    [place]  Two level    Alternate Level Stairs-Number of Steps  14    Alternate Level Stairs-Rails  Left      Prior Function   Level of Independence  Independent      Cognition   Overall Cognitive Status  Within Functional Limits for tasks assessed      Observation/Other Assessments   Observations  R inferomedial knee slightly edematous    Focus on Therapeutic Outcomes (FOTO)   Knee: 52 (48% limited,37% predicted)       Sensation   Light Touch  Appears Intact   reports B 5th digit N/T     Coordination   Gross Motor Movements are Fluid and Coordinated  Yes      Posture/Postural Control   Posture/Postural Control  Postural limitations    Postural Limitations  Posterior pelvic tilt;Rounded Shoulders      ROM / Strength   AROM / PROM / Strength  AROM;PROM;Strength      AROM   AROM Assessment Site  Knee    Right/Left Knee  Right;Left    Right Knee Extension  0    Right Knee  Flexion  129    Left Knee Extension  0    Left Knee Flexion  135      PROM   PROM Assessment Site  Knee    Right/Left Knee  Right;Left    Right Knee Extension  -1    Right Knee Flexion  --   deferred d/t pain   Left Knee Extension  -1    Left Knee Flexion  140      Strength   Strength Assessment Site  Hip;Knee;Ankle    Right/Left Hip  Right;Left    Right Hip Flexion  4+/5    Right Hip Extension  4/5    Right Hip ABduction  4/5    Right Hip ADduction  4+/5    Left Hip Flexion  4/5    Left Hip Extension  4/5    Left Hip ABduction  4+/5    Left Hip ADduction  4+/5    Right/Left Knee  Right;Left    Right Knee Flexion  4/5    Right Knee Extension  4/5   pain along patellar tendon   Left Knee Flexion  4/5    Left Knee Extension  5/5    Right/Left Ankle  Right;Left    Right Ankle Dorsiflexion  5/5    Right Ankle Plantar Flexion  4+/5    Left Ankle Dorsiflexion  5/5    Left Ankle Plantar Flexion  4+/5      Flexibility   Soft Tissue Assessment /Muscle Length  yes   B hip flexors tight   Hamstrings  B midly tight    Quadriceps  B midly tight      Palpation   Patella mobility  R knee WFL and nonpain    Palpation comment  TTP R medial tibial plateau and tibial tuberosity      Ambulation/Gait   Gait Pattern  Step-through pattern;Decreased hip/knee flexion - left;Decreased stance time - right                Objective measurements completed on examination: See above findings.              PT Education - 04/17/18 1803    Education  Details  prognosis, POC, HEP    Person(s) Educated  Patient    Methods  Explanation;Demonstration;Tactile cues;Verbal cues;Handout    Comprehension  Verbalized understanding;Returned demonstration       PT Short Term Goals - 04/17/18 1811      PT SHORT TERM GOAL #1   Title  Patient to be independent with initial HEP.    Time  3    Period  Weeks    Status  New    Target Date  05/08/18        PT Long Term Goals -  04/17/18 1811      PT LONG TERM GOAL #1   Title  Patient to be independent with advanced HEP.    Time  6    Period  Weeks    Status  New    Target Date  05/29/18      PT LONG TERM GOAL #2   Title  Patient to demonstrate symmetrical and pain free R knee AROM/PROM to opposite side.     Time  6    Period  Weeks    Status  New    Target Date  05/29/18      PT LONG TERM GOAL #3   Title  Patient to demonstrate >=4+/5 strength in B LEs.    Time  6    Period  Weeks    Status  New    Target Date  05/29/18      PT LONG TERM GOAL #4   Title  Patient to report tolerance of treadmill walking for 30 min with moderate incline withotu c/o pain.     Time  6    Period  Weeks    Status  New    Target Date  05/29/18      PT LONG TERM GOAL #5   Title  Patient to demonstrate reciprocal stair climbing up/down 13 steps without use of handrail with good control and quad stability throughout.     Time  6    Period  Weeks    Status  New    Target Date  05/29/18             Plan - 04/17/18 1804    Clinical Impression Statement  Patient is a 47y/o F presenting to OPPT with c/o R knee pain of ~6 months duration with insidious onset. Did have worsening of pain after increasing activity levels at the gym. Aggravating factors inconsistent however include: ADLs, walking inclines on treadmill, walking on uneven surfaces, stairs. Patient today with tenderness and edema in R inferiomedial knee, decreased ROM, decreased strength, and decreased LE flexibility. Educated on and received handout for gentle strengthening and stretching HPE. Patient reported understanding. Would benefit from skilled PT services 2x/week for 6 weeks to address aforementioned impairments.     Clinical Presentation  Stable    Clinical Decision Making  Low    Rehab Potential  Good    PT Frequency  2x / week    PT Duration  6 weeks    PT Treatment/Interventions  ADLs/Self Care Home Management;Cryotherapy;Electrical  Stimulation;Iontophoresis 60m/ml Dexamethasone;Moist Heat;Ultrasound;DME Instruction;Gait training;Stair training;Functional mobility training;Therapeutic activities;Therapeutic exercise;Manual techniques;Orthotic Fit/Training;Patient/family education;Neuromuscular re-education;Balance training;Manual lymph drainage;Compression bandaging;Passive range of motion;Dry needling;Energy conservation;Splinting;Taping;Vasopneumatic Device    PT Next Visit Plan  reassess HEP    Consulted and Agree with Plan of Care  Patient       Patient will benefit from skilled therapeutic intervention in order to improve the following deficits and impairments:  Increased edema, Decreased activity tolerance, Decreased strength, Pain, Difficulty walking, Decreased range of motion, Improper body mechanics, Postural dysfunction, Impaired flexibility  Visit Diagnosis: Chronic pain of right knee  Stiffness of right knee, not elsewhere classified  Muscle weakness (generalized)  Other symptoms and signs involving the musculoskeletal system     Problem List Patient Active Problem List   Diagnosis Date Noted  . Low back pain 06/02/2011  . HTN (hypertension) 10/12/2010    [name] Harvey, PT, DPT 04/17/18 6:15 PM   [facility] [address]  [place], [place], [address] Phone: [phone]   Fax:  [phone]  Name: [name] MRN: [id number] Date of Birth: 07-18-1970   PHYSICAL THERAPY DISCHARGE SUMMARY  Visits from Start of Care: 1  Current functional level related to goals / functional outcomes: See above clinical summary; patient did not return since initial eval   Remaining deficits: See above   Education / Equipment: HEP  Plan: Patient agrees to discharge.  Patient goals were not met. Patient is being discharged due to not returning since the last visit.  ?????     [name] Harvey, PT, DPT 05/23/18 9:00 AM

## 2018-05-18 ENCOUNTER — Ambulatory Visit: Payer: 59 | Admitting: Family Medicine

## 2018-06-01 ENCOUNTER — Ambulatory Visit: Payer: 59 | Admitting: Family Medicine

## 2018-06-16 ENCOUNTER — Ambulatory Visit: Payer: 59 | Admitting: Family Medicine

## 2018-07-10 ENCOUNTER — Other Ambulatory Visit: Payer: Self-pay | Admitting: Cardiology

## 2018-07-11 ENCOUNTER — Other Ambulatory Visit: Payer: Self-pay

## 2018-07-11 ENCOUNTER — Encounter: Payer: Self-pay | Admitting: Family Medicine

## 2018-07-11 ENCOUNTER — Ambulatory Visit: Payer: 59 | Admitting: Family Medicine

## 2018-07-11 VITALS — BP 158/103 | HR 75 | Ht 63.0 in | Wt 182.0 lb

## 2018-07-11 DIAGNOSIS — M25561 Pain in right knee: Secondary | ICD-10-CM | POA: Diagnosis not present

## 2018-07-11 MED ORDER — LABETALOL HCL 300 MG PO TABS: 300.0000 mg | ORAL_TABLET | Freq: Two times a day (BID) | ORAL | 0 refills | Status: DC

## 2018-07-11 MED ORDER — METHYLPREDNISOLONE ACETATE 40 MG/ML IJ SUSP
40.0000 mg | Freq: Once | INTRAMUSCULAR | Status: AC
  Administered 2018-07-11: 40 mg via INTRA_ARTICULAR

## 2018-07-11 MED ORDER — HYDROCHLOROTHIAZIDE 25 MG PO TABS: 25.0000 mg | ORAL_TABLET | Freq: Every day | ORAL | 0 refills | Status: DC

## 2018-07-11 MED ORDER — POTASSIUM CHLORIDE ER 10 MEQ PO CPCR: 10.0000 meq | ORAL_CAPSULE | Freq: Two times a day (BID) | ORAL | 0 refills | Status: DC

## 2018-07-11 NOTE — Progress Notes (Signed)
PCP: Merri BrunetteSmith, Candace, MD  Subjective:   HPI: Patient is a 47 y.o. female here for right knee pain.  9/26: Patient reports she's had pain in right knee for about a year but worse past 2.5 months. Told remotely she had tendinitis of this knee but was also given a cortisone injection, brace. Continues to have pain anteromedial right knee at 2/10 level, up to 8/10 and sharp with walking at times. Could wake her up at night. Giving out at times on stairs and on treadmill - most recently 2 weeks ago. Tried icing and rest which have helped some. No skin changes, numbness.  12/31: Patient reports she's improved since last visit but still with 2/10 level pain medially. Worse with stairs. No catching, locking, giving out. Not taking anything currently - rarely takes advil. Not icing. Done with physical therapy. No skin changes.  Past Medical History:  Diagnosis Date  . Hypertension   . Mitral valve prolapse     Current Outpatient Medications on File Prior to Visit  Medication Sig Dispense Refill  . albuterol (PROVENTIL HFA;VENTOLIN HFA) 108 (90 Base) MCG/ACT inhaler     . cetirizine (ZYRTEC) 10 MG tablet Take 10 mg by mouth daily as needed.     . flunisolide (NASALIDE) 25 MCG/ACT (0.025%) SOLN SPRAY 2 SPRAYS INTO EACH NOSTRIL TWICE A DAY  5  . hyoscyamine (LEVSIN SL) 0.125 MG SL tablet PLACE 1 TABLET UNDER THE TONGUE AND ALLOW TO DISSOLVE AS NEEDED EVERY 4 HRS IF NEEDED  0  . Multiple Vitamin (MULTIVITAMIN) capsule Take 1 capsule by mouth daily.      Marland Kitchen. VIBERZI 100 MG TABS Take 1 tablet by mouth 2 (two) times daily with a meal.  1   No current facility-administered medications on file prior to visit.     Past Surgical History:  Procedure Laterality Date  . BREAST LUMPECTOMY    . MYOMECTOMY      Allergies  Allergen Reactions  . Motrin [Ibuprofen] Swelling  . Other Other (See Comments)    Mold,cat & dog hair, raw carrots,raw apples, burch &mapple trees, and ragweed  .  Penicillins Hives  . Sulfa Antibiotics Hives    Social History   Socioeconomic History  . Marital status: Married    Spouse name: Not on file  . Number of children: Not on file  . Years of education: Not on file  . Highest education level: Not on file  Occupational History  . Not on file  Social Needs  . Financial resource strain: Not on file  . Food insecurity:    Worry: Not on file    Inability: Not on file  . Transportation needs:    Medical: Not on file    Non-medical: Not on file  Tobacco Use  . Smoking status: Never Smoker  . Smokeless tobacco: Never Used  Substance and Sexual Activity  . Alcohol use: Not on file  . Drug use: No  . Sexual activity: Not on file  Lifestyle  . Physical activity:    Days per week: Not on file    Minutes per session: Not on file  . Stress: Not on file  Relationships  . Social connections:    Talks on phone: Not on file    Gets together: Not on file    Attends religious service: Not on file    Active member of club or organization: Not on file    Attends meetings of clubs or organizations: Not on file  Relationship status: Not on file  . Intimate partner violence:    Fear of current or ex partner: Not on file    Emotionally abused: Not on file    Physically abused: Not on file    Forced sexual activity: Not on file  Other Topics Concern  . Not on file  Social History Narrative  . Not on file    Family History  Problem Relation Age of Onset  . Hypertension Mother   . Hyperlipidemia Mother   . Diabetes Mother   . Hypertension Father   . Hyperlipidemia Father   . Diabetes Father   . Heart attack Maternal Uncle   . Diabetes Maternal Grandmother   . Hyperlipidemia Maternal Grandmother   . Hypertension Maternal Grandmother   . Sudden death Neg Hx     BP (!) 158/103   Pulse 75   Ht 5\' 3"  (1.6 m)   Wt 182 lb (82.6 kg)   BMI 32.24 kg/m   Review of Systems: See HPI above.     Objective:  Physical Exam:  Gen:  NAD, comfortable in exam room  Right knee: No gross deformity, ecchymoses, swelling.  VMO atrophy. TTP medial joint line.  No other tenderness. FROM with 5/5 strength. Negative ant/post drawers. Negative valgus/varus testing. Negative lachmans. Positive mcmurrays, apleys, thessalys.  Negative bounce, patellar apprehension. NV intact distally.   Assessment & Plan:  1. Right knee pain - consistent with mild arthritis and medial meniscus tear.  Continue home exercises.  Injection given today.  Tylenol, topical medications, supplements reviewed, aleve.  Heat or ice.  F/u in 5-6 weeks.  Consider imaging if not improving.  After informed written consent timeout was performed, patient was seated on exam table. Right knee was prepped with alcohol swab and utilizing anteromedial approach, patient's right knee was injected intraarticularly with 3:1 bupivicaine: depomedrol. Patient tolerated the procedure well without immediate complications.

## 2018-07-11 NOTE — Patient Instructions (Signed)
You have evidence of mild arthritis and small medial meniscus tear. Avoid deep squats, lunges, leg press for now. Continue home exercises, physical therapy. These are the different medications you can take for this only if needed: Tylenol 500mg  1-2 tabs three times a day for pain. Capsaicin, aspercreme, or biofreeze topically up to four times a day may also help with pain. Some supplements that may help for arthritis: Boswellia extract, curcumin, pycnogenol Aleve 1-2 tabs twice a day with food Cortisone injections are an option - you were given this today. It's important that you continue to stay active. Straight leg raises, knee extensions, straight leg raises with foot turned outwards 3 sets of 10 once a day (add ankle weight if these become too easy). Shoe inserts with good arch support may be helpful. Heat or ice 15 minutes at a time 3-4 times a day as needed to help with pain. Follow up with me in 5-6 weeks. If still not improving would consider imaging - x-rays then MRI.

## 2018-08-10 ENCOUNTER — Encounter: Payer: Self-pay | Admitting: Cardiovascular Disease

## 2018-09-04 ENCOUNTER — Ambulatory Visit: Payer: 59 | Admitting: Cardiovascular Disease

## 2018-09-07 ENCOUNTER — Ambulatory Visit (INDEPENDENT_AMBULATORY_CARE_PROVIDER_SITE_OTHER): Payer: Commercial Managed Care - PPO | Admitting: Cardiovascular Disease

## 2018-09-07 ENCOUNTER — Other Ambulatory Visit: Payer: Self-pay | Admitting: *Deleted

## 2018-09-07 ENCOUNTER — Encounter: Payer: Self-pay | Admitting: Cardiovascular Disease

## 2018-09-07 VITALS — BP 140/82 | HR 74 | Ht 62.5 in | Wt 186.0 lb

## 2018-09-07 DIAGNOSIS — E782 Mixed hyperlipidemia: Secondary | ICD-10-CM | POA: Diagnosis not present

## 2018-09-07 DIAGNOSIS — I10 Essential (primary) hypertension: Secondary | ICD-10-CM

## 2018-09-07 LAB — BASIC METABOLIC PANEL
BUN/Creatinine Ratio: 11 (ref 9–23)
BUN: 9 mg/dL (ref 6–24)
CALCIUM: 9.5 mg/dL (ref 8.7–10.2)
CHLORIDE: 102 mmol/L (ref 96–106)
CO2: 21 mmol/L (ref 20–29)
Creatinine, Ser: 0.8 mg/dL (ref 0.57–1.00)
GFR calc Af Amer: 99 mL/min/{1.73_m2} (ref >59)
GFR calc non Af Amer: 86 mL/min/{1.73_m2} (ref >59)
GLUCOSE: 98 mg/dL (ref 65–99)
POTASSIUM: 3.8 mmol/L (ref 3.5–5.2)
Sodium: 140 mmol/L (ref 134–144)

## 2018-09-07 MED ORDER — SPIRONOLACTONE 25 MG PO TABS: 25.0000 mg | ORAL_TABLET | Freq: Every day | ORAL | 11 refills | Status: DC

## 2018-09-07 NOTE — Patient Instructions (Addendum)
Medication Instructions:  Your physician has recommended you make the following change in your medication:  STOP HCTZ (Hydrochlorothiazide)  STOP Kdur (Potassium chloride) START Spironolactone (Aldactone) 25 mg once daily   Lab work: TODAY - basic metabolic panel  Your physician recommends that you return for lab work in: 3 weeks for basic metabolic panel on Wed. March 18 at 11:00 am   If you have labs (blood work) drawn today and your tests are completely normal, you will receive your results only by: Marland Kitchen MyChart Message (if you have MyChart) OR . A paper copy in the mail If you have any lab test that is abnormal or we need to change your treatment, we will call you to review the results.   Testing/Procedures: None Ordered    Follow-Up: Your physician recommends that you return for a follow-up appointment on Wed. March 18 at 11:00 am   At [facility], you and your health needs are our priority.  As part of our continuing mission to provide you with exceptional heart care, we have created designated Provider Care Teams.  These Care Teams include your primary Cardiologist (physician) and Advanced Practice Providers (APPs -  Physician Assistants and Nurse Practitioners) who all work together to provide you with the care you need, when you need it. You will need a follow up appointment in:  6 months.  Please call our office 2 months in advance to schedule this appointment.  You may see [name], MD or one of the following Advanced Practice Providers on your designated Care Team: Tereso Newcomer, PA-C [place], [place] . Berton Bon, NP

## 2018-09-07 NOTE — Progress Notes (Signed)
[name] Date of Birth  [birth year] [name] [address]    [address] [place], [place]  [address] [phone]  Fax  [phone]   Problem list 1. Essential hypertension History of Present Illness:  05/23/12:; 48 a female with a history of hypertension. She has done very well. She was started on labetalol. She tolerates this very well.  She's not had any episodes of chest pain or shortness of breath.  She has a cold now and is on Claritin -D.  She has not been exercising regularly - occasionally once a week.  [name] 20, 2015:  [name] is doing ok  Her father had a CVA and she is having to deal with this issue.    She has not been checking her BP.  She has been feeling well.    Dec. 30, 2015:  [name] is a 48 yo with hx of HTN .  BP is up a bit today because she has not taken her meds today.   Dec. 9, 2016: Doing ok.   Has not been working out as much .  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  Does not always remember to take the 2nd Labetalol tablet No CP or dyspnea.    September 07, 2018: [name]  is seen today after a 2 -year absence.  She saw [name] last year BP has been a little higher . Does not take her labetalol  BID every day  Tries to avoid table salt,   Admits to eating a poor diet Eats pre-packaged and processed foods.    Knows she needs to work out more .  Has tried working out but has slacked off   Current Outpatient Medications on File Prior to Visit  Medication Sig Dispense Refill  . albuterol (PROVENTIL HFA;VENTOLIN HFA) 108 (90 Base) MCG/ACT inhaler Inhale 2 puffs into the lungs every 4 (four) hours as needed.     . cetirizine (ZYRTEC) 10 MG tablet Take 10 mg by mouth daily as needed.     . flunisolide (NASALIDE) 25 MCG/ACT (0.025%) SOLN SPRAY 2 SPRAYS INTO EACH NOSTRIL TWICE A DAY  5  . hydrochlorothiazide (HYDRODIURIL) 25 MG  tablet Take 1 tablet (25 mg total) by mouth daily. 90 tablet 0  . hyoscyamine (LEVSIN SL) 0.125 MG SL tablet PLACE 1 TABLET UNDER THE TONGUE AND ALLOW TO DISSOLVE AS NEEDED EVERY 4 HRS IF NEEDED  0  . labetalol (NORMODYNE) 300 MG tablet Take 1 tablet (300 mg total) by mouth 2 (two) times daily. 180 tablet 0  . Multiple Vitamin (MULTIVITAMIN) capsule Take 1 capsule by mouth daily.      . potassium chloride (MICRO-K) 10 MEQ CR capsule Take 1 capsule (10 mEq total) by mouth 2 (two) times daily. 180 capsule 0  . VIBERZI 100 MG TABS Take 1 tablet by mouth 2 (two) times daily with a meal.  1   No current facility-administered medications on file prior to visit.     Allergies  Allergen Reactions  . Motrin [Ibuprofen] Swelling  . Other Other (See Comments)    Mold,cat & dog hair, raw carrots,raw apples, burch &mapple trees, and ragweed  . Penicillins Hives  . Sulfa Antibiotics Hives    Past Medical History:  Diagnosis Date  . Hypertension   . Mitral valve prolapse     Past Surgical History:  Procedure Laterality Date  . BREAST LUMPECTOMY    . MYOMECTOMY      Social History   Tobacco Use  Smoking Status Never Smoker  Smokeless Tobacco Never Used    Social History   Substance and Sexual Activity  Alcohol Use Never  . Frequency: Never    Family History  Problem Relation Age of Onset  . Hypertension Mother   . Hyperlipidemia Mother   . Diabetes Mother   . Hypertension Father   . Hyperlipidemia Father   . Diabetes Father   . Heart attack Maternal Uncle   . Diabetes Maternal Grandmother   . Hyperlipidemia Maternal Grandmother   . Hypertension Maternal Grandmother   . Sudden death Neg Hx     Reviw of Systems:  Reviewed in the HPI.  All other systems are negative.  Physical Exam: Blood pressure 140/82, pulse 74, height 5' 2.5" (1.588 m), weight 186 lb (84.4 kg), SpO2 96 %.  GEN:  Young black female,  NAD  HEENT: Normal NECK: No JVD; No carotid bruits LYMPHATICS:  No lymphadenopathy CARDIAC: RRR  RESPIRATORY:  Clear to auscultation without rales, wheezing or rhonchi  ABDOMEN: Soft, non-tender, non-distended MUSCULOSKELETAL:  No edema; No deformity  SKIN: Warm and dry NEUROLOGIC:  Alert and oriented x 3   ECG: September 07, 2018: Normal sinus rhythm at 70 beats minute.  First-degree AV block.  Poor R wave progression-?  Previous anterior wall myocardial infarction versus lead placement.    Assessment / Plan:   1. Essential hypertension: Her diastolic blood pressure remains mildly elevated.  I think she may respond well to spironolactone.  We will discontinue the HCTZ and potassium chloride.  We will start spironolactone 25 mg a day.  Return for nurse visit and plate basic metabolic profile in 2 to 3 weeks. 2. Hyperlipidemia: -      [name], MD  09/07/2018 9:42 AM    [facility] [address] [place],  [address] [place], [place]  [address] Pager [phone] Phone: [phone]; Fax: [phone]   [facility]  [address] [place], [place]  [address] [phone]   Fax [phone]

## 2018-09-11 ENCOUNTER — Ambulatory Visit: Payer: Self-pay | Admitting: Physician Assistant

## 2018-09-27 ENCOUNTER — Other Ambulatory Visit: Payer: Commercial Managed Care - PPO

## 2018-09-27 ENCOUNTER — Ambulatory Visit: Payer: Commercial Managed Care - PPO

## 2018-10-17 ENCOUNTER — Telehealth: Payer: Self-pay | Admitting: Cardiovascular Disease

## 2018-10-17 DIAGNOSIS — I10 Essential (primary) hypertension: Secondary | ICD-10-CM

## 2018-10-17 MED ORDER — SPIRONOLACTONE 25 MG PO TABS: 50.0000 mg | ORAL_TABLET | Freq: Every day | ORAL | 11 refills | Status: DC

## 2018-10-17 NOTE — Telephone Encounter (Signed)
Follow up:   Patient returning call to report BP 150/108 today 154/97,116/88,140/89 161/110,

## 2018-10-17 NOTE — Telephone Encounter (Signed)
Spoke with patient who states her BP has been elevated recently. Her medications were changed at the office visit on 2/27 and she was unable to come back to the office for her BP check due to Covid 19 regulations. She reports using a new BP cuff and states that she can feel when her BP is elevated. She has not been restricting sodium intake, but will work on doing that more consistently. We discussed her medications, including ACE-I or ARB for her strong family hx of type 2 DM. She requests to increase spironolactone for awhile due to the increased stress of work and home life right now and is open to discuss changes in the future. She is aware that Dr. Elease Hashimoto may recommend that she stop labetolol in the future but she would like to continue with that at this time. I advised that I will route to Dr. Elease Hashimoto for agreement to increase spironolactone and will call her back with his advice. I advised that she will also likely benefit from increased dietary K+ since her last bmet shows a low normal serum K+. She states she may take her kdur a few times a week as supplementation. She verbalized understanding and agreement with plan of care and thanked me for the call.

## 2018-10-17 NOTE — Telephone Encounter (Signed)
Spoke with Dr. Elease Hashimoto by telephone who advised that patient may increase spironolactone to 50 mg daily. He advised she will need a bmet in approximately 1 month to evaluate kidney function and serum K+.  I called patient and reviewed his advice with her. She requests #60 pills of the spironolactone 25 mg be sent to [facility] and that she will remember that when she finished the bottle it will be time to schedule lab work. I advised her to call back sooner with questions or concerns and she thanked me for the call.

## 2019-04-25 ENCOUNTER — Other Ambulatory Visit: Payer: Self-pay | Admitting: Cardiovascular Disease

## 2019-04-25 MED ORDER — SPIRONOLACTONE 25 MG PO TABS: 50.0000 mg | ORAL_TABLET | Freq: Every day | ORAL | 0 refills | Status: DC

## 2019-04-25 MED ORDER — LABETALOL HCL 300 MG PO TABS: 300.0000 mg | ORAL_TABLET | Freq: Two times a day (BID) | ORAL | 0 refills | Status: DC

## 2019-05-28 ENCOUNTER — Other Ambulatory Visit: Payer: Self-pay | Admitting: Cardiovascular Disease

## 2019-06-04 ENCOUNTER — Other Ambulatory Visit: Payer: Self-pay | Admitting: Cardiovascular Disease

## 2019-06-14 ENCOUNTER — Encounter: Payer: Self-pay | Admitting: Cardiovascular Disease

## 2019-06-14 ENCOUNTER — Ambulatory Visit: Payer: Commercial Managed Care - PPO | Admitting: Cardiovascular Disease

## 2019-06-14 ENCOUNTER — Other Ambulatory Visit: Payer: Self-pay

## 2019-06-14 ENCOUNTER — Encounter (INDEPENDENT_AMBULATORY_CARE_PROVIDER_SITE_OTHER): Payer: Self-pay

## 2019-06-14 VITALS — BP 140/85 | HR 83 | Ht 62.5 in | Wt 190.2 lb

## 2019-06-14 DIAGNOSIS — E66811 Obesity, class 1: Secondary | ICD-10-CM

## 2019-06-14 DIAGNOSIS — E782 Mixed hyperlipidemia: Secondary | ICD-10-CM

## 2019-06-14 DIAGNOSIS — I10 Essential (primary) hypertension: Secondary | ICD-10-CM

## 2019-06-14 DIAGNOSIS — E669 Obesity, unspecified: Secondary | ICD-10-CM | POA: Diagnosis not present

## 2019-06-14 MED ORDER — LABETALOL HCL 300 MG PO TABS: 300.0000 mg | ORAL_TABLET | Freq: Two times a day (BID) | ORAL | 3 refills | Status: DC

## 2019-06-14 MED ORDER — SPIRONOLACTONE 50 MG PO TABS: 50.0000 mg | ORAL_TABLET | Freq: Every day | ORAL | 3 refills | Status: DC

## 2019-06-14 NOTE — Progress Notes (Signed)
Mart Piggs Date of Birth  Sep 14, 1970 [facility] [address]    [place], [place]  [address] [phone]  Fax  [phone]   Problem list 1. Essential hypertension History of Present Illness:  05/23/12:; 48 a female with a history of hypertension. She has done very well. She was started on labetalol. She tolerates this very well.  She's not had any episodes of chest pain or shortness of breath.  She has a cold now and is on Claritin -D.  She has not been exercising regularly - occasionally once a week.  [name] 20, 2015:  Jun is doing ok  Her father had a CVA and she is having to deal with this issue.    She has not been checking her BP.  She has been feeling well.    Dec. 30, 2015:  Aliveah is a 48 yo with hx of HTN .  BP is up a bit today because she has not taken her meds today.   Dec. 9, 2016: Doing ok.   Has not been working out as much .  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  Does not always remember to take the 2nd Labetalol tablet No CP or dyspnea.    September 07, 2018: Terese  is seen today after a 2 -year absence.  She saw Cecilie Kicks last year BP has been a little higher . Does not take her labetalol  BID every day  Tries to avoid table salt,   Admits to eating a poor diet Eats pre-packaged and processed foods.    Knows she needs to work out more .  Has tried working out but has slacked off   June 14, 2019: Philis Nettle is seen today for follow-up of her hypertension.  I saw her in February and we started her on spironolactone. She has been on labetalol 300 mg twice a day. She ran out of her spironolactone last week .  Has been taking her Labetalol   Her bp when she was on both meds was 469G - 295M Diastolic was 84-13 Felt lightheaded today.    Current Outpatient Medications on File Prior to Visit  Medication Sig Dispense  Refill  . albuterol (PROVENTIL HFA;VENTOLIN HFA) 108 (90 Base) MCG/ACT inhaler Inhale 2 puffs into the lungs every 4 (four) hours as needed.     . cetirizine (ZYRTEC) 10 MG tablet Take 10 mg by mouth daily as needed.     . flunisolide (NASALIDE) 25 MCG/ACT (0.025%) SOLN SPRAY 2 SPRAYS INTO EACH NOSTRIL TWICE A DAY  5  . hyoscyamine (LEVSIN SL) 0.125 MG SL tablet PLACE 1 TABLET UNDER THE TONGUE AND ALLOW TO DISSOLVE AS NEEDED EVERY 4 HRS IF NEEDED  0  . labetalol (NORMODYNE) 300 MG tablet TAKE 1 TABLET BY MOUTH  TWICE DAILY 180 tablet 0  . Multiple Vitamin (MULTIVITAMIN) capsule Take 1 capsule by mouth daily.      Marland Kitchen spironolactone (ALDACTONE) 25 MG tablet TAKE 2 TABLETS BY MOUTH  DAILY 180 tablet 0  . valACYclovir (VALTREX) 500 MG tablet Use as directed    . vitamin B-12 (CYANOCOBALAMIN) 500 MCG tablet Take 500 mcg by mouth daily.     No current facility-administered medications on file prior to visit.     Allergies  Allergen Reactions  . Motrin [Ibuprofen] Swelling  . Other Other (See Comments)  Mold,cat & dog hair, raw carrots,raw apples, burch &mapple trees, and ragweed  . Penicillins Hives  . Sulfa Antibiotics Hives    Past Medical History:  Diagnosis Date  . Hypertension   . Mitral valve prolapse     Past Surgical History:  Procedure Laterality Date  . BREAST LUMPECTOMY    . MYOMECTOMY      Social History   Tobacco Use  Smoking Status Never Smoker  Smokeless Tobacco Never Used    Social History   Substance and Sexual Activity  Alcohol Use Never  . Frequency: Never    Family History  Problem Relation Age of Onset  . Hypertension Mother   . Hyperlipidemia Mother   . Diabetes Mother   . Hypertension Father   . Hyperlipidemia Father   . Diabetes Father   . Heart attack Maternal Uncle   . Diabetes Maternal Grandmother   . Hyperlipidemia Maternal Grandmother   . Hypertension Maternal Grandmother   . Sudden death Neg Hx     Reviw of Systems:  Reviewed  in the HPI.  All other systems are negative.   Physical Exam: Blood pressure 140/85, pulse 83, height 5' 2.5" (1.588 m), weight 190 lb 3.2 oz (86.3 kg), SpO2 99 %.  GEN:  Well nourished, well developed in no acute distress HEENT: Normal NECK: No JVD; No carotid bruits LYMPHATICS: No lymphadenopathy CARDIAC: RRR , no murmurs, rubs, gallops RESPIRATORY:  Clear to auscultation without rales, wheezing or rhonchi  ABDOMEN: Soft, non-tender, non-distended MUSCULOSKELETAL:  No edema; No deformity  SKIN: Warm and dry NEUROLOGIC:  Alert and oriented x 3   ECG:     Assessment / Plan:   1. Essential hypertension: Blood pressure is mildly elevated but she stopped her Aldactone because she ran out.  We will refill her spironolactone at 50 mg a day and labetalol 300 mg twice a day.  She will return for a basic metabolic profile in 2 to 3 weeks.  I have advised her not to take her potassium supplement while she is on the spironolactone.  We will have her see an APP in 3 to 4 months for follow-up visit.  She will continue to  monitor her blood pressure.   2. Hyperlipidemia: -   Stable   [name], MD  06/14/2019 4:20 PM    [facility] [address] [place],  [address] [place], [place]  [address] Pager [phone] Phone: [phone]; Fax: [phone]   [facility]  [address] [place], [place]  [address] [phone]   Fax [phone]

## 2019-06-14 NOTE — Patient Instructions (Signed)
Medication Instructions:  Your physician recommends that you continue on your current medications as directed. Please refer to the Current Medication list given to you today. Refills of your Spironolactone and Labetalol have been sent to [facility]  *If you need a refill on your cardiac medications before your next appointment, please call your pharmacy*  Lab Work: Your physician recommends that you return for lab work on Tuesday Dec. 22  You may come in anytime between 7:30 am and 4:45 pm and you do not have to fast   Testing/Procedures: None Ordered   Follow-Up: At [facility], you and your health needs are our priority.  As part of our continuing mission to provide you with exceptional heart care, we have created designated Provider Care Teams.  These Care Teams include your primary Cardiologist (physician) and Advanced Practice Providers (APPs -  Physician Assistants and Nurse Practitioners) who all work together to provide you with the care you need, when you need it.  Your next appointment:   3 month(s) on March 3 at 2:45 pm  The format for your next appointment:   In Person   Provider:   Richardson Dopp, [place]

## 2019-06-15 NOTE — Addendum Note (Signed)
Addended by: Emmaline Life on: 06/15/2019 11:15 AM   Modules accepted: Orders

## 2019-06-27 NOTE — Addendum Note (Signed)
Addended by: Emmaline Life on: 06/27/2019 10:32 AM   Modules accepted: Orders

## 2019-07-02 ENCOUNTER — Telehealth: Payer: Self-pay | Admitting: Cardiovascular Disease

## 2019-07-02 NOTE — Telephone Encounter (Signed)
Left detailed message on patient's voice mail that I will cancel her lab appointment for tomorrow until further details are determined. I advised her of the lab work that we need and asked her to find out if she had BMET today with Dr. Tamala Julian. I advised that I will call back tomorrow to discuss.

## 2019-07-02 NOTE — Telephone Encounter (Signed)
New Message   Patient is calling in and states that she has her physical today with Dr. Carol Ada and had blood work done. Patient wants to know if she needs to have blood work done again tomorrow by our office if she had it done today during her physical. Please call and advise.

## 2019-07-03 ENCOUNTER — Other Ambulatory Visit: Payer: Commercial Managed Care - PPO

## 2019-07-03 NOTE — Telephone Encounter (Signed)
Spoke with patient who states she will call her PCP to find out if she had bmet. If so, she will ask for results to be faxed to [phone]. If not, she will call back to schedule lab work. She thanked me for the call.

## 2019-07-04 NOTE — Telephone Encounter (Signed)
Spoke with medical records attendant at [facility] and verified that patient did have BMET done and it has been faxed to our office. I thanked her for her help.

## 2019-07-04 NOTE — Telephone Encounter (Signed)
Notified patient that kidney function and potassium level on lab work sent from Dr. Thompson Caul office are stable on current medications. She verbalized understanding and thanked me for the call.

## 2019-08-21 ENCOUNTER — Ambulatory Visit (INDEPENDENT_AMBULATORY_CARE_PROVIDER_SITE_OTHER): Payer: Commercial Managed Care - PPO | Admitting: Family Medicine

## 2019-08-21 ENCOUNTER — Other Ambulatory Visit: Payer: Self-pay

## 2019-08-21 ENCOUNTER — Encounter (INDEPENDENT_AMBULATORY_CARE_PROVIDER_SITE_OTHER): Payer: Self-pay | Admitting: Family Medicine

## 2019-08-21 VITALS — BP 120/83 | HR 72 | Temp 98.3°F | Ht 62.0 in | Wt 185.0 lb

## 2019-08-21 DIAGNOSIS — G4719 Other hypersomnia: Secondary | ICD-10-CM

## 2019-08-21 DIAGNOSIS — R5383 Other fatigue: Secondary | ICD-10-CM

## 2019-08-21 DIAGNOSIS — Z6833 Body mass index (BMI) 33.0-33.9, adult: Secondary | ICD-10-CM

## 2019-08-21 DIAGNOSIS — M25561 Pain in right knee: Secondary | ICD-10-CM

## 2019-08-21 DIAGNOSIS — E669 Obesity, unspecified: Secondary | ICD-10-CM

## 2019-08-21 DIAGNOSIS — I10 Essential (primary) hypertension: Secondary | ICD-10-CM | POA: Diagnosis not present

## 2019-08-21 DIAGNOSIS — Z0289 Encounter for other administrative examinations: Secondary | ICD-10-CM

## 2019-08-21 DIAGNOSIS — F3289 Other specified depressive episodes: Secondary | ICD-10-CM

## 2019-08-21 DIAGNOSIS — F419 Anxiety disorder, unspecified: Secondary | ICD-10-CM

## 2019-08-21 DIAGNOSIS — E7849 Other hyperlipidemia: Secondary | ICD-10-CM

## 2019-08-21 DIAGNOSIS — R739 Hyperglycemia, unspecified: Secondary | ICD-10-CM

## 2019-08-21 DIAGNOSIS — R0602 Shortness of breath: Secondary | ICD-10-CM

## 2019-08-21 DIAGNOSIS — D649 Anemia, unspecified: Secondary | ICD-10-CM

## 2019-08-21 DIAGNOSIS — K588 Other irritable bowel syndrome: Secondary | ICD-10-CM

## 2019-08-21 MED ORDER — CLONAZEPAM 0.5 MG PO TABS: 0.5000 mg | ORAL_TABLET | Freq: Every day | ORAL | 0 refills | Status: DC

## 2019-08-21 NOTE — Progress Notes (Signed)
Dear Dr. Melburn Popper,   Thank you for referring [name] to our clinic. The following note includes my evaluation and treatment recommendations.  Chief Complaint:   OBESITY [name] (MR# [id number]) is a 49 y.o. female who presents for evaluation and treatment of obesity and related comorbidities. Current BMI is Body mass index is 33.84 kg/m. [name] has been struggling with her weight for many years and has been unsuccessful in either losing weight, maintaining weight loss, or reaching her healthy weight goal.  [name] is currently in the action stage of change and ready to dedicate time achieving and maintaining a healthier weight. [name] is interested in becoming our patient and working on intensive lifestyle modifications including (but not limited to) diet and exercise for weight loss.  [name]'s habits were reviewed today and are as follows: her desired weight loss is 25-30 pounds, she started gaining weight in her 41s after she had her daughter and was married, her heaviest weight ever was 192 pounds, she is a picky eater, she craves salty foods, french fries, chips, she snacks frequently in the evenings, she wakes up frequently in the middle of the night to eat, she skips meals frequently, she is frequently drinking liquids with calories, she frequently makes poor food choices and she struggles with emotional eating.  Depression Screen [name]'s Food and Mood (modified PHQ-9) score was 11.  Depression screen PHQ 2/9 08/21/2019  Decreased Interest 2  Down, Depressed, Hopeless 2  PHQ - 2 Score 4  Altered sleeping 2  Tired, decreased energy 3  Change in appetite 1  Feeling bad or failure about yourself  1  Trouble concentrating 0  Moving slowly or fidgety/restless 0  Suicidal thoughts 0  PHQ-9 Score 11  Difficult doing work/chores Not difficult at all   Subjective:   1. Other fatigue [name] admits to daytime somnolence and reports waking up still tired at times. Patent  has a history of symptoms of daytime fatigue and morning fatigue. [name] generally gets 6 hours of sleep per night, and states that she has poor quality sleep. Snoring is not present. Apneic episodes are not present. Epworth Sleepiness Score is 10.  2. SOB (shortness of breath) on exertion Sireen notes increasing shortness of breath with exercising and seems to be worsening over time with weight gain. She notes getting out of breath sooner with activity than she used to. This has gotten worse recently. [name] denies shortness of breath at rest or orthopnea.  3. Essential hypertension, with mitral valve prolapse Review: taking medications as instructed, no medication side effects noted, no chest pain on exertion, no dyspnea on exertion, no swelling of ankles.  She is taking spironolactone and labetalol. Followed by Dr. Elease Hashimoto.  BP Readings from Last 3 Encounters:  08/21/19 120/83  06/14/19 140/85  09/07/18 140/82   4. Right knee pain, unspecified chronicity [name] suffers from pain in her right knee.  5. Other irritable bowel syndrome [name] has been diagnosed with irritable bowel syndrome.  6. Other hyperlipidemia [name] has hyperlipidemia and has been trying to improve her cholesterol levels with intensive lifestyle modification including a low saturated fat diet, exercise and weight loss. She denies any chest pain, claudication or myalgias.  Lab Results  Component Value Date   ALT 25 02/23/2016   AST 35 02/23/2016   ALKPHOS 47 02/23/2016   BILITOT 0.7 02/23/2016   Lab Results  Component Value Date   CHOL 202 (H) 06/20/2015   HDL 47 06/20/2015  LDLCALC 118 06/20/2015   TRIG 183 (H) 06/20/2015   CHOLHDL 4.3 06/20/2015   7. Anemia, unspecified type [name] is not a vegetarian.  She does not have a history of weight loss surgery.   CBC Latest Ref Rng & Units 02/23/2016 11/06/2008 11/06/2008  WBC 4.0 - 10.5 K/uL 10.4 18.3(H) 19.9(H)  Hemoglobin 12.0 - 15.0 g/dL 47.8 7.0 CRITICAL RESULT  CALLED TO, READ BACK BY AND VERIFIED WITH: CALDWELL,VA @1530  ON BY FLEMINGS(LL) 6.5 CRITICAL RESULT CALLED TO, READ BACK BY AND VERIFIED WITH: ALEXIS,R. AT [id number] ON 11/06/2008 BY HOUEGNIFIOM DELTA CHECK NOTED(LL)  Hematocrit 36.0 - 46.0 % 41.3 20.6(L) 19.3(L)  Platelets 150 - 400 K/uL 255 165 147(L)   8. Hyperglycemia [name] has a history of some elevated blood glucose readings without a diagnosis of diabetes.   9. Excessive daytime sleepiness Situation [name] of Dozing or Sleeping  Sitting and reading 1 = slight [name] of dozing or sleeping  Watching television 2 = moderate [name] of dozing or sleeping  Sitting inactive in a public place (theater or meeting) 0 = would never doze or sleep  Lying down in the afternoon when circumstances permit 3 = high [name] of dozing or sleeping  Sitting and talking to someone 0 = would never doze or sleep  Sitting quietly after lunch without alcohol 2 = moderate [name] of dozing or sleeping  Sitting as a passenger in a car for an hour 1 = slight [name] of dozing or sleeping  In a car, while stopped for a few minutes in traffic 1 = slight [name] of dozing or sleeping  TOTAL 10   10. Anxiety [name] has significant situational anxiety. Her ex-husband lives with her and her 28 year old daughter. He has lymphoma. Sleeps on the sofa. Her daughter has controlled IDDM. She drinks 1/2 to 1 bottle of wine each night.   11. Other depression, with emotional eating [name] is struggling with emotional eating and using food for comfort to the extent that it is negatively impacting her health. She has been working on behavior modification techniques to help reduce her emotional eating and has been unsuccessful. She shows no sign of suicidal or homicidal ideations.  PHQ-9 is 11 today.  Assessment/Plan:   1. Other fatigue Margorie does feel that her weight is causing her energy to be lower than it should be. Fatigue may be related to obesity, depression or many other  causes. Labs will be ordered, and in the meanwhile, Amarise will focus on self care including making healthy food choices, increasing physical activity and focusing on stress reduction.  Orders - EKG 12-Lead - TSH - T4, free - T3 - VITAMIN D 25 Hydroxy (Vit-D Deficiency, Fractures)  2. SOB (shortness of breath) on exertion Jalysa does feel that she gets out of breath more easily that she used to when she exercises. Tate's shortness of breath appears to be obesity related and exercise induced. She has agreed to work on weight loss and gradually increase exercise to treat her exercise induced shortness of breath. Will continue to monitor closely.  Orders - CBC with Differential/Platelet - Vitamin B1  3. Essential hypertension, with mitral valve prolapse Audry is working on healthy weight loss and exercise to improve blood pressure control. We will watch for signs of hypotension as she continues her lifestyle modifications.  4. Right knee pain, unspecified chronicity Will follow because mobility and pain control are important for weight management.  5. Other irritable bowel syndrome Will monitor.  6.  Other hyperlipidemia Cardiovascular risk and specific lipid/LDL goals reviewed.  We discussed several lifestyle modifications today and [name] will continue to work on diet, exercise and weight loss efforts. Orders and follow up as documented in patient record.   Counseling Intensive lifestyle modifications are the first line treatment for this issue. . Dietary changes: Increase soluble fiber. Decrease simple carbohydrates. . Exercise changes: Moderate to vigorous-intensity aerobic activity 150 minutes per week if tolerated. . Lipid-lowering medications: see documented in medical record.  Orders - Lipid panel  7. Anemia, unspecified type Orders and follow up as documented in patient record.  Counseling . Iron is essential for our bodies to make red blood cells.  Reasons that someone  may be deficient include: an iron-deficient diet (more likely in those following vegan or vegetarian diets), women with heavy menses, patients with GI disorders or poor absorption, patients that have had bariatric surgery, frequent blood donors, patients with cancer, and patients with heart disease.   [name] foods include dark leafy greens, red and white meats, eggs, seafood, and beans.   . Certain foods and drinks prevent your body from absorbing iron properly. Avoid eating these foods in the same meal as iron-rich foods or with iron supplements. These foods include: coffee, black tea, and red wine; milk, dairy products, and foods that are high in calcium; beans and soybeans; whole grains.  . Constipation can be a side effect of iron supplementation. Increased water and fiber intake are helpful. Water goal: > 2 liters/day. Fiber goal: > 25 grams/day.  Orders - Anemia panel  8. Hyperglycemia Fasting labs will be obtained and results with be discussed with [name] in 2 weeks at her follow up visit. In the meanwhile [name] was started on a lower simple carbohydrate diet and will work on weight loss efforts.  Orders - Comprehensive metabolic panel - Hemoglobin A1c - Insulin, random  9. Excessive daytime sleepiness Will monitor.  10. Anxiety Behavior modification techniques were discussed today to help Nikcole deal with her anxiety.  Orders and follow up as documented in patient record.   Orders - Ambulatory referral to Psychology - clonazePAM (KLONOPIN) 0.5 MG tablet; Take 1-2 tablets (0.5-1 mg total) by mouth daily in the afternoon.  Dispense: 20 tablet; Refill: 0  11. Other depression, with emotional eating Behavior modification techniques were discussed today to help [name] deal with her emotional/non-hunger eating behaviors.  Orders and follow up as documented in patient record.   12. Class 1 obesity with serious comorbidity and body mass index (BMI) of 33.0 to 33.9 in adult, unspecified  obesity type [name] is currently in the action stage of change and her goal is to continue with weight loss efforts. I recommend [name] begin the structured treatment plan as follows:  She has agreed to the Category 1 Plan.  Exercise goals: For substantial health benefits, adults should do at least 150 minutes (2 hours and 30 minutes) a week of moderate-intensity, or 75 minutes (1 hour and 15 minutes) a week of vigorous-intensity aerobic physical activity, or an equivalent combination of moderate- and vigorous-intensity aerobic activity. Aerobic activity should be performed in episodes of at least 10 minutes, and preferably, it should be spread throughout the week. Adults should also include muscle-strengthening activities that involve all major muscle groups on 2 or more days a week.   Behavioral modification strategies: increasing lean protein intake, decreasing simple carbohydrates, increasing vegetables, increasing water intake and decreasing liquid calories.  She was informed of the importance of frequent follow-up  visits to maximize her success with intensive lifestyle modifications for her multiple health conditions. She was informed we would discuss her lab results at her next visit unless there is a critical issue that needs to be addressed sooner. Jasmane agreed to keep her next visit at the agreed upon time to discuss these results.  Objective:   Blood pressure 120/83, pulse 72, temperature 98.3 F (36.8 C), temperature source Oral, height 5\' 2"  (1.575 m), weight 185 lb (83.9 kg), SpO2 98 %. Body mass index is 33.84 kg/m.  EKG: Normal sinus rhythm, rate 75 bpm.  Indirect Calorimeter completed today shows a VO2 of 179 and a REE of 1245.  Her calculated basal metabolic rate is thus her basal metabolic rate is worse than expected.  General: Cooperative, alert, well developed, in no acute distress. HEENT: Conjunctivae and lids unremarkable. Cardiovascular: Regular rhythm.  Lungs:  Normal work of breathing. Neurologic: No focal deficits.   Lab Results  Component Value Date   CREATININE 0.80 09/07/2018   BUN 9 09/07/2018   NA 140 09/07/2018   K 3.8 09/07/2018   CL 102 09/07/2018   CO2 21 09/07/2018   Lab Results  Component Value Date   ALT 25 02/23/2016   AST 35 02/23/2016   ALKPHOS 47 02/23/2016   BILITOT 0.7 02/23/2016   Lab Results  Component Value Date   CHOL 202 (H) 06/20/2015   HDL 47 06/20/2015   LDLCALC 118 06/20/2015   TRIG 183 (H) 06/20/2015   CHOLHDL 4.3 06/20/2015   Lab Results  Component Value Date   WBC 10.4 02/23/2016   HGB 14.3 02/23/2016   HCT 41.3 02/23/2016   MCV 79.3 02/23/2016   PLT 255 02/23/2016   Attestation Statements:   This is the patient's first visit at [facility]. The patient's NEW PATIENT PACKET was reviewed at length. Included in the packet: current and past health history, medications, allergies, ROS, gynecologic history (women only), surgical history, family history, social history, weight history, weight loss surgery history (for those that have had weight loss surgery), nutritional evaluation, mood and food questionnaire, PHQ9, Epworth questionnaire, sleep habits questionnaire, patient life and health improvement goals questionnaire. These will all be scanned into the patient's chart under media.   During the visit, I independently reviewed the patient's EKG, bioimpedance scale results, and indirect calorimeter results. I used this information to tailor a meal plan for the patient that will help her to lose weight and will improve her obesity-related conditions going forward. I performed a medically necessary appropriate examination and/or evaluation. I discussed the assessment and treatment plan with the patient. The patient was provided an opportunity to ask questions and all were answered. The patient agreed with the plan and demonstrated an understanding of the instructions. Labs were ordered at this  visit and will be reviewed at the next visit unless more critical results need to be addressed immediately. I referred to behavioral health. Clinical information was updated and documented in the EMR.   Time spent on visit including pre-visit chart review and post-visit care was 60 minutes.   I, 02/25/2016, CMA, am acting as Insurance claims handler for Energy manager, DO.  I have reviewed the above documentation for accuracy and completeness, and I agree with the above. [employer], DO

## 2019-08-25 LAB — COMPREHENSIVE METABOLIC PANEL
ALT: 27 IU/L (ref 0–32)
AST: 31 IU/L (ref 0–40)
Albumin/Globulin Ratio: 1.6 (ref 1.2–2.2)
Albumin: 4.3 g/dL (ref 3.8–4.8)
Alkaline Phosphatase: 60 IU/L (ref 39–117)
BUN/Creatinine Ratio: 11 (ref 9–23)
BUN: 8 mg/dL (ref 6–24)
Bilirubin Total: 0.6 mg/dL (ref 0.0–1.2)
CO2: 21 mmol/L (ref 20–29)
Calcium: 9.7 mg/dL (ref 8.7–10.2)
Chloride: 102 mmol/L (ref 96–106)
Creatinine, Ser: 0.73 mg/dL (ref 0.57–1.00)
GFR calc Af Amer: 111 mL/min/{1.73_m2} (ref >59)
GFR calc non Af Amer: 96 mL/min/{1.73_m2} (ref >59)
Globulin, Total: 2.7 g/dL (ref 1.5–4.5)
Glucose: 97 mg/dL (ref 65–99)
Potassium: 4.1 mmol/L (ref 3.5–5.2)
Sodium: 136 mmol/L (ref 134–144)
Total Protein: 7 g/dL (ref 6.0–8.5)

## 2019-08-25 LAB — LIPID PANEL
Chol/HDL Ratio: 4.8 ratio — ABNORMAL HIGH (ref 0.0–4.4)
Cholesterol, Total: 205 mg/dL — ABNORMAL HIGH (ref 100–199)
HDL: 43 mg/dL (ref >39)
LDL Chol Calc (NIH): 138 mg/dL — ABNORMAL HIGH (ref 0–99)
Triglycerides: 132 mg/dL (ref 0–149)
VLDL Cholesterol Cal: 24 mg/dL (ref 5–40)

## 2019-08-25 LAB — CBC WITH DIFFERENTIAL/PLATELET
Basophils Absolute: 0 10*3/uL (ref 0.0–0.2)
Basos: 0 %
EOS (ABSOLUTE): 0.4 10*3/uL (ref 0.0–0.4)
Eos: 4 %
Hemoglobin: 14.9 g/dL (ref 11.1–15.9)
Immature Grans (Abs): 0 10*3/uL (ref 0.0–0.1)
Immature Granulocytes: 0 %
Lymphocytes Absolute: 2.8 10*3/uL (ref 0.7–3.1)
Lymphs: 28 %
MCH: 28.3 pg (ref 26.6–33.0)
MCHC: 33.5 g/dL (ref 31.5–35.7)
MCV: 84 fL (ref 79–97)
Monocytes Absolute: 0.7 10*3/uL (ref 0.1–0.9)
Monocytes: 7 %
Neutrophils Absolute: 6 10*3/uL (ref 1.4–7.0)
Neutrophils: 61 %
Platelets: 222 10*3/uL (ref 150–450)
RBC: 5.27 x10E6/uL (ref 3.77–5.28)
RDW: 13.6 % (ref 11.7–15.4)
WBC: 9.9 10*3/uL (ref 3.4–10.8)

## 2019-08-25 LAB — ANEMIA PANEL
Ferritin: 220 ng/mL — ABNORMAL HIGH (ref 15–150)
Folate, Hemolysate: 409 ng/mL
Folate, RBC: 919 ng/mL (ref >498)
Hematocrit: 44.5 % (ref 34.0–46.6)
Iron Saturation: 34 % (ref 15–55)
Iron: 117 ug/dL (ref 27–159)
Retic Ct Pct: 1.4 % (ref 0.6–2.6)
Total Iron Binding Capacity: 344 ug/dL (ref 250–450)
UIBC: 227 ug/dL (ref 131–425)
Vitamin B-12: 1603 pg/mL — ABNORMAL HIGH (ref 232–1245)

## 2019-08-25 LAB — HEMOGLOBIN A1C
Est. average glucose Bld gHb Est-mCnc: 126 mg/dL
Hgb A1c MFr Bld: 6 % — ABNORMAL HIGH (ref 4.8–5.6)

## 2019-08-25 LAB — INSULIN, RANDOM: INSULIN: 12.4 u[IU]/mL (ref 2.6–24.9)

## 2019-08-25 LAB — VITAMIN B1: Thiamine: 152.8 nmol/L (ref 66.5–200.0)

## 2019-08-25 LAB — T3: T3, Total: 122 ng/dL (ref 71–180)

## 2019-08-25 LAB — TSH: TSH: 2.28 u[IU]/mL (ref 0.450–4.500)

## 2019-08-25 LAB — T4, FREE: Free T4: 1.06 ng/dL (ref 0.82–1.77)

## 2019-08-25 LAB — VITAMIN D 25 HYDROXY (VIT D DEFICIENCY, FRACTURES): Vit D, 25-Hydroxy: 17.7 ng/mL — ABNORMAL LOW (ref 30.0–100.0)

## 2019-09-04 ENCOUNTER — Ambulatory Visit (INDEPENDENT_AMBULATORY_CARE_PROVIDER_SITE_OTHER): Payer: Commercial Managed Care - PPO | Admitting: Family Medicine

## 2019-09-04 ENCOUNTER — Encounter (INDEPENDENT_AMBULATORY_CARE_PROVIDER_SITE_OTHER): Payer: Self-pay | Admitting: Family Medicine

## 2019-09-04 ENCOUNTER — Other Ambulatory Visit: Payer: Self-pay

## 2019-09-04 VITALS — BP 120/84 | HR 80 | Temp 98.5°F | Ht 62.0 in | Wt 186.0 lb

## 2019-09-04 DIAGNOSIS — Z9189 Other specified personal risk factors, not elsewhere classified: Secondary | ICD-10-CM | POA: Diagnosis not present

## 2019-09-04 DIAGNOSIS — F1029 Alcohol dependence with unspecified alcohol-induced disorder: Secondary | ICD-10-CM

## 2019-09-04 DIAGNOSIS — E669 Obesity, unspecified: Secondary | ICD-10-CM

## 2019-09-04 DIAGNOSIS — R7303 Prediabetes: Secondary | ICD-10-CM | POA: Diagnosis not present

## 2019-09-04 DIAGNOSIS — E559 Vitamin D deficiency, unspecified: Secondary | ICD-10-CM

## 2019-09-04 DIAGNOSIS — Z6834 Body mass index (BMI) 34.0-34.9, adult: Secondary | ICD-10-CM

## 2019-09-04 DIAGNOSIS — E7849 Other hyperlipidemia: Secondary | ICD-10-CM | POA: Diagnosis not present

## 2019-09-04 DIAGNOSIS — F3289 Other specified depressive episodes: Secondary | ICD-10-CM

## 2019-09-05 MED ORDER — METFORMIN HCL 500 MG PO TABS: 500.0000 mg | ORAL_TABLET | Freq: Every day | ORAL | 0 refills | Status: DC

## 2019-09-05 MED ORDER — VITAMIN D (ERGOCALCIFEROL) 1.25 MG (50000 UNIT) PO CAPS: 50000.0000 [IU] | ORAL_CAPSULE | ORAL | 0 refills | Status: DC

## 2019-09-05 NOTE — Progress Notes (Signed)
Chief Complaint:   OBESITY [name] is here to discuss her progress with her obesity treatment plan along with follow-up of her obesity related diagnoses. [name] is on the Category 1 Plan and states she is following her eating plan approximately 20% of the time. Macall states she is walking for 30-40 minutes 1 times per week.  Today's visit was #: 2 Starting weight: 185 lbs Starting date: 08/21/2019 Today's weight: 186 lbs Today's date: 09/05/2019 Total lbs lost to date: 0 Total lbs lost since last in-office visit: 0  Interim History: [name] reports that she did not start the diet until about 2 days after her visit.  Her ex-husband (living in her home) was hospitalized for a few days (stage 4 non-Hodgkin's lymphoma with the complication of a bowel obstruction).  She says she did well during those days.  Since he has been home, she has been his caregiver.  She reports that she was able to have 1 bottle of wine last 1 week, but had 1 bottle over the weekend due to increased stress.  Subjective:   1. Prediabetes [name] has a diagnosis of prediabetes based on her elevated HgA1c and was informed this puts her at greater risk of developing diabetes. She continues to work on diet and exercise to decrease her risk of diabetes. She denies nausea or hypoglycemia.  Lab Results  Component Value Date   HGBA1C 6.0 (H) 08/21/2019   Lab Results  Component Value Date   INSULIN 12.4 08/21/2019   2. Other hyperlipidemia [name] has hyperlipidemia and has been trying to improve her cholesterol levels with intensive lifestyle modification including a low saturated fat diet, exercise and weight loss. She denies any chest pain, claudication or myalgias.  Lab Results  Component Value Date   ALT 27 08/21/2019   AST 31 08/21/2019   ALKPHOS 60 08/21/2019   BILITOT 0.6 08/21/2019   Lab Results  Component Value Date   CHOL 205 (H) 08/21/2019   HDL 43 08/21/2019   LDLCALC 138 (H) 08/21/2019   TRIG 132  08/21/2019   CHOLHDL 4.8 (H) 08/21/2019   3. Vitamin D deficiency [name]'s Vitamin D level was 17.7 on 08/21/2019. She is not currently taking vit D. She denies nausea, vomiting or muscle weakness.  4. Alcohol dependence with unspecified alcohol-induced disorder (HCC) [name] has decreased her alcohol intake significantly over the past 2 weeks.  5. Other depression, with emotional eating  [name] is struggling with emotional eating and using food for comfort to the extent that it is negatively impacting her health. She has been working on behavior modification techniques to help reduce her emotional eating and has been unsuccessful. She shows no sign of suicidal or homicidal ideations.  6. At risk for impaired function of liver [name] is at risk for impaired function of liver due to likely diagnosis of fatty liver as evidenced by recent elevated liver enzymes.   Assessment/Plan:   1. Prediabetes Rabia will continue to work on weight loss, exercise, and decreasing simple carbohydrates to help decrease the risk of diabetes.   Orders - metFORMIN (GLUCOPHAGE) 500 MG tablet; Take 1 tablet (500 mg total) by mouth daily with breakfast.  Dispense: 30 tablet; Refill: 0  2. Other hyperlipidemia Cardiovascular risk and specific lipid/LDL goals reviewed.  We discussed several lifestyle modifications today and Kimberlye will continue to work on diet, exercise and weight loss efforts. Orders and follow up as documented in patient record.   Counseling Intensive lifestyle modifications are the first  line treatment for this issue. . Dietary changes: Increase soluble fiber. Decrease simple carbohydrates. . Exercise changes: Moderate to vigorous-intensity aerobic activity 150 minutes per week if tolerated. . Lipid-lowering medications: see documented in medical record.  3. Vitamin D deficiency Low Vitamin D level contributes to fatigue and are associated with obesity, breast, and colon cancer. She agrees to take  prescription Vitamin D @50 ,000 IU every week and will follow-up for routine testing of Vitamin D, at least 2-3 times per year to avoid over-replacement.  Orders - Vitamin D, Ergocalciferol, (DRISDOL) 1.25 MG (50000 UNIT) CAPS capsule; Take 1 capsule (50,000 Units total) by mouth every 7 (seven) days.  Dispense: 4 capsule; Refill: 0  4. Alcohol dependence with unspecified alcohol-induced disorder (HCC) [name] will continue to work on cessation altogether.  5. Other depression, with emotional eating  Patient was referred to Dr. Mallie Mussel, our Bariatric Psychologist, for evaluation due to her elevated PHQ-9 score and significant struggles with emotional eating.  6. At risk for impaired function of liver [name] was given approximately 15 minutes of counseling today regarding prevention of impaired liver function. [name] was educated about her risk of developing NASH or even liver failure and advised that the only proven treatment for NAFLD was weight loss of at least 5-10% of body weight.   7. Class 1 obesity with serious comorbidity and body mass index (BMI) of 34.0 to 34.9 in adult, unspecified obesity type [name] is currently in the action stage of change. As such, her goal is to continue with weight loss efforts. She has agreed to the Category 1 Plan.   Exercise goals: As is.  Behavioral modification strategies: increasing lean protein intake and decreasing alcohol intake.  [name] has agreed to follow-up with our clinic in 2 weeks. She was informed of the importance of frequent follow-up visits to maximize her success with intensive lifestyle modifications for her multiple health conditions.   Objective:   Blood pressure 120/84, pulse 80, temperature 98.5 F (36.9 C), temperature source Oral, height 5\' 2"  (1.575 m), weight 186 lb (84.4 kg), last menstrual period 08/18/2019, SpO2 98 %. Body mass index is 34.02 kg/m.  General: Cooperative, alert, well developed, in no acute distress. HEENT:  Conjunctivae and lids unremarkable. Cardiovascular: Regular rhythm.  Lungs: Normal work of breathing. Neurologic: No focal deficits.   Lab Results  Component Value Date   CREATININE 0.73 08/21/2019   BUN 8 08/21/2019   NA 136 08/21/2019   K 4.1 08/21/2019   CL 102 08/21/2019   CO2 21 08/21/2019   Lab Results  Component Value Date   ALT 27 08/21/2019   AST 31 08/21/2019   ALKPHOS 60 08/21/2019   BILITOT 0.6 08/21/2019   Lab Results  Component Value Date   HGBA1C 6.0 (H) 08/21/2019   Lab Results  Component Value Date   INSULIN 12.4 08/21/2019   Lab Results  Component Value Date   TSH 2.280 08/21/2019   Lab Results  Component Value Date   CHOL 205 (H) 08/21/2019   HDL 43 08/21/2019   LDLCALC 138 (H) 08/21/2019   TRIG 132 08/21/2019   CHOLHDL 4.8 (H) 08/21/2019   Lab Results  Component Value Date   WBC 9.9 08/21/2019   HGB 14.9 08/21/2019   HCT 44.5 08/21/2019   MCV 84 08/21/2019   PLT 222 08/21/2019   Lab Results  Component Value Date   IRON 117 08/21/2019   TIBC 344 08/21/2019   FERRITIN 220 (H) 08/21/2019   Attestation Statements:  Reviewed by clinician on day of visit: allergies, medications, problem list, medical history, surgical history, family history, social history, and previous encounter notes.  I, Water quality scientist, CMA, am acting as Location manager for [employer], DO.  I have reviewed the above documentation for accuracy and completeness, and I agree with the above. Briscoe Deutscher, DO

## 2019-09-07 ENCOUNTER — Encounter (INDEPENDENT_AMBULATORY_CARE_PROVIDER_SITE_OTHER): Payer: Self-pay | Admitting: Family Medicine

## 2019-09-10 ENCOUNTER — Ambulatory Visit (INDEPENDENT_AMBULATORY_CARE_PROVIDER_SITE_OTHER): Payer: Commercial Managed Care - PPO | Admitting: Psychology

## 2019-09-10 DIAGNOSIS — F4321 Adjustment disorder with depressed mood: Secondary | ICD-10-CM | POA: Diagnosis not present

## 2019-09-10 NOTE — Telephone Encounter (Signed)
Agree. Thanks, Marcelino Duster. Tereso Newcomer, PA-C    09/10/2019 1:31 PM

## 2019-09-10 NOTE — Telephone Encounter (Signed)
Please advise 

## 2019-09-12 ENCOUNTER — Ambulatory Visit: Payer: Commercial Managed Care - PPO | Admitting: Physician Assistant

## 2019-09-12 NOTE — Telephone Encounter (Signed)
This patient is still on my schedule today. Can we make sure if she wants to come in today or reschedule as noted here? Tereso Newcomer, PA-C    09/12/2019 8:11 AM

## 2019-09-13 ENCOUNTER — Other Ambulatory Visit (INDEPENDENT_AMBULATORY_CARE_PROVIDER_SITE_OTHER): Payer: Self-pay | Admitting: Family Medicine

## 2019-09-13 DIAGNOSIS — F419 Anxiety disorder, unspecified: Secondary | ICD-10-CM

## 2019-09-13 MED ORDER — CLONAZEPAM 0.5 MG PO TABS: 0.5000 mg | ORAL_TABLET | Freq: Every day | ORAL | 0 refills | Status: DC

## 2019-09-19 ENCOUNTER — Other Ambulatory Visit: Payer: Self-pay

## 2019-09-19 ENCOUNTER — Encounter (INDEPENDENT_AMBULATORY_CARE_PROVIDER_SITE_OTHER): Payer: Self-pay | Admitting: Family Medicine

## 2019-09-19 ENCOUNTER — Ambulatory Visit (INDEPENDENT_AMBULATORY_CARE_PROVIDER_SITE_OTHER): Payer: Commercial Managed Care - PPO | Admitting: Family Medicine

## 2019-09-19 VITALS — BP 114/73 | HR 75 | Temp 97.5°F | Ht 62.0 in | Wt 185.0 lb

## 2019-09-19 DIAGNOSIS — R7303 Prediabetes: Secondary | ICD-10-CM | POA: Diagnosis not present

## 2019-09-19 DIAGNOSIS — Z9189 Other specified personal risk factors, not elsewhere classified: Secondary | ICD-10-CM | POA: Diagnosis not present

## 2019-09-19 DIAGNOSIS — F3289 Other specified depressive episodes: Secondary | ICD-10-CM

## 2019-09-19 DIAGNOSIS — F411 Generalized anxiety disorder: Secondary | ICD-10-CM

## 2019-09-19 DIAGNOSIS — E559 Vitamin D deficiency, unspecified: Secondary | ICD-10-CM | POA: Diagnosis not present

## 2019-09-19 DIAGNOSIS — E7849 Other hyperlipidemia: Secondary | ICD-10-CM

## 2019-09-19 DIAGNOSIS — Z6833 Body mass index (BMI) 33.0-33.9, adult: Secondary | ICD-10-CM

## 2019-09-19 DIAGNOSIS — E669 Obesity, unspecified: Secondary | ICD-10-CM

## 2019-09-20 MED ORDER — CLONAZEPAM 0.5 MG PO TABS: 0.5000 mg | ORAL_TABLET | Freq: Every day | ORAL | 0 refills | Status: DC

## 2019-09-20 MED ORDER — BUPROPION HCL ER (XL) 150 MG PO TB24: 150.0000 mg | ORAL_TABLET | Freq: Every day | ORAL | 0 refills | Status: DC

## 2019-09-20 NOTE — Progress Notes (Signed)
Chief Complaint:   OBESITY [name] is here to discuss her progress with her obesity treatment plan along with follow-up of her obesity related diagnoses. [name] is on the Category 1 Plan and states she is following her eating plan approximately 75-80% of the time. [name] states she is exercising for 0 minutes 0 times per week.  Today's visit was #: 3 Starting weight: 185 lbs Starting date: 08/21/2019 Today's weight: 185 lbs Today's date: 09/19/2019 Total lbs lost to date: 0 Total lbs lost since last in-office visit: 1 lb  Interim History: [name] reports that it is hard to eat all the food on her plab.  She provided the following food recall:  Breakfast:  Two eggs and [place] bacon or sausage, 40-70 calorie toast, spread of jelly.  She has a Mudlogger cereal coming. Lunch:  Sandwiches or peanut butter crackers. Dinner:  She says she struggles and is not hungry. She will sometimes have a glass of red wine and cheese. She drinks wine before or with dinner.   Joely has made great strides re: setting boundaries with her ex-husband and thinking about her own future.   Subjective:   1. Prediabetes [name] has a diagnosis of prediabetes based on her elevated HgA1c and was informed this puts her at greater risk of developing diabetes. She continues to work on diet and exercise to decrease her risk of diabetes. She denies nausea or hypoglycemia.  She has been taking metformin but says it has been casing nausea and diarrhea.  Lab Results  Component Value Date   HGBA1C 6.0 (H) 08/21/2019   Lab Results  Component Value Date   INSULIN 12.4 08/21/2019   2. Vitamin D deficiency [name]'s Vitamin D level was 17.7 on 08/21/2019. She is currently taking vit D. She denies nausea, vomiting or muscle weakness.  3. Other hyperlipidemia [name] has hyperlipidemia and has been trying to improve her cholesterol levels with intensive lifestyle modification including a low saturated fat diet, exercise and weight  loss. She denies any chest pain, claudication or myalgias.  Lab Results  Component Value Date   ALT 27 08/21/2019   AST 31 08/21/2019   ALKPHOS 60 08/21/2019   BILITOT 0.6 08/21/2019   Lab Results  Component Value Date   CHOL 205 (H) 08/21/2019   HDL 43 08/21/2019   LDLCALC 138 (H) 08/21/2019   TRIG 132 08/21/2019   CHOLHDL 4.8 (H) 08/21/2019   4. GAD (generalized anxiety disorder) [name] takes clonazepam 0.5-1 mg as needed for her anxiety symptoms. She has not had the medication for the past week and has done well with continuing to decrease alcohol intake.  Okay refill today.   5. Other depression, with emotional eating [name] is struggling with emotional eating and using food for comfort to the extent that it is negatively impacting her health. She has been working on behavior modification techniques to help reduce her emotional eating and has been unsuccessful. She shows no sign of suicidal or homicidal ideations.    6. At risk for diabetes mellitus [name] is at higher than average risk for developing diabetes due to her obesity.   Assessment/Plan:   1. Prediabetes [name] will continue to work on weight loss, exercise, and decreasing simple carbohydrates to help decrease the risk of diabetes.  Will stop metformin due to GI upset.  2. Vitamin D deficiency Low Vitamin D level contributes to fatigue and are associated with obesity, breast, and colon cancer. She agrees to continue to take prescription  Vitamin D @50 ,000 IU every week and will follow-up for routine testing of Vitamin D, at least 2-3 times per year to avoid over-replacement.  3. Other hyperlipidemia Cardiovascular risk and specific lipid/LDL goals reviewed.  We discussed several lifestyle modifications today and Zelta will continue to work on diet, exercise and weight loss efforts. Orders and follow up as documented in patient record.   Counseling Intensive lifestyle modifications are the first line treatment for this  issue. . Dietary changes: Increase soluble fiber. Decrease simple carbohydrates. . Exercise changes: Moderate to vigorous-intensity aerobic activity 150 minutes per week if tolerated. . Lipid-lowering medications: see documented in medical record.  4. GAD (generalized anxiety disorder) Behavior modification techniques were discussed today to help Gurneet deal with her anxiety.  Orders and follow up as documented in patient record.   Orders - clonazePAM (KLONOPIN) 0.5 MG tablet; Take 1-2 tablets (0.5-1 mg total) by mouth daily in the afternoon.  Dispense: 20 tablet; Refill: 0  5. Other depression, with emotional eating Behavior modification techniques were discussed today to help Marga deal with her emotional/non-hunger eating behaviors.  Orders and follow up as documented in patient record.   Orders - buPROPion (WELLBUTRIN XL) 150 MG 24 hr tablet; Take 1 tablet (150 mg total) by mouth daily.  Dispense: 30 tablet; Refill: 0  6. At risk for diabetes mellitus [name] was given approximately 15 minutes of education and counseling today. We discussed intensive lifestyle modifications today with an emphasis on weight loss as well as increasing exercise and decreasing simple carbohydrates in her diet. We also reviewed medication options with an emphasis on risk versus benefit of those discussed.   Repetitive spaced learning was employed today to elicit superior memory formation and behavioral change.  7. Class 1 obesity with serious comorbidity and body mass index (BMI) of 33.0 to 33.9 in adult, unspecified obesity type [name] is currently in the action stage of change. As such, her goal is to continue with weight loss efforts. She has agreed to keeping a food journal and adhering to recommended goals of 1000 calories and 75 grams of protein.   Exercise goals: All adults should avoid inactivity. Some physical activity is better than none, and adults who participate in any amount of physical activity  gain some health benefits.  Behavioral modification strategies: increasing lean protein intake and increasing water intake.  [name] has agreed to follow-up with our clinic in 2 weeks. She was informed of the importance of frequent follow-up visits to maximize her success with intensive lifestyle modifications for her multiple health conditions.   Objective:   Blood pressure 114/73, pulse 75, temperature (!) 97.5 F (36.4 C), temperature source Oral, height 5\' 2"  (1.575 m), weight 185 lb (83.9 kg), last menstrual period 08/18/2019, SpO2 98 %. Body mass index is 33.84 kg/m.  General: Cooperative, alert, well developed, in no acute distress. HEENT: Conjunctivae and lids unremarkable. Cardiovascular: Regular rhythm.  Lungs: Normal work of breathing. Neurologic: No focal deficits.   Lab Results  Component Value Date   CREATININE 0.73 08/21/2019   BUN 8 08/21/2019   NA 136 08/21/2019   K 4.1 08/21/2019   CL 102 08/21/2019   CO2 21 08/21/2019   Lab Results  Component Value Date   ALT 27 08/21/2019   AST 31 08/21/2019   ALKPHOS 60 08/21/2019   BILITOT 0.6 08/21/2019   Lab Results  Component Value Date   HGBA1C 6.0 (H) 08/21/2019   Lab Results  Component Value Date   INSULIN  12.4 08/21/2019   Lab Results  Component Value Date   TSH 2.280 08/21/2019   Lab Results  Component Value Date   CHOL 205 (H) 08/21/2019   HDL 43 08/21/2019   LDLCALC 138 (H) 08/21/2019   TRIG 132 08/21/2019   CHOLHDL 4.8 (H) 08/21/2019   Lab Results  Component Value Date   WBC 9.9 08/21/2019   HGB 14.9 08/21/2019   HCT 44.5 08/21/2019   MCV 84 08/21/2019   PLT 222 08/21/2019   Lab Results  Component Value Date   IRON 117 08/21/2019   TIBC 344 08/21/2019   FERRITIN 220 (H) 08/21/2019   Attestation Statements:   Reviewed by clinician on day of visit: allergies, medications, problem list, medical history, surgical history, family history, social history, and previous encounter  notes.  I, Water quality scientist, CMA, am acting as Location manager for [employer], DO.  I have reviewed the above documentation for accuracy and completeness, and I agree with the above. Briscoe Deutscher, DO

## 2019-09-26 ENCOUNTER — Ambulatory Visit (INDEPENDENT_AMBULATORY_CARE_PROVIDER_SITE_OTHER): Payer: Commercial Managed Care - PPO | Admitting: Psychology

## 2019-09-26 DIAGNOSIS — F4321 Adjustment disorder with depressed mood: Secondary | ICD-10-CM

## 2019-10-03 ENCOUNTER — Other Ambulatory Visit (INDEPENDENT_AMBULATORY_CARE_PROVIDER_SITE_OTHER): Payer: Self-pay | Admitting: Family Medicine

## 2019-10-03 DIAGNOSIS — E559 Vitamin D deficiency, unspecified: Secondary | ICD-10-CM

## 2019-10-03 MED ORDER — VITAMIN D (ERGOCALCIFEROL) 1.25 MG (50000 UNIT) PO CAPS: 50000.0000 [IU] | ORAL_CAPSULE | ORAL | 0 refills | Status: DC

## 2019-10-10 ENCOUNTER — Ambulatory Visit (INDEPENDENT_AMBULATORY_CARE_PROVIDER_SITE_OTHER): Payer: Commercial Managed Care - PPO | Admitting: Psychology

## 2019-10-10 DIAGNOSIS — F4321 Adjustment disorder with depressed mood: Secondary | ICD-10-CM

## 2019-10-13 ENCOUNTER — Ambulatory Visit: Payer: Commercial Managed Care - PPO | Attending: Internal Medicine

## 2019-10-13 DIAGNOSIS — Z23 Encounter for immunization: Secondary | ICD-10-CM

## 2019-10-13 NOTE — Progress Notes (Signed)
   Covid-19 Vaccination Clinic  Name:  LAQUITTA DOMINSKI    MRN: [id number] DOB: [birth year]  10/13/2019  Ms. Guilmette was observed post Covid-19 immunization for 15 minutes without incident. She was provided with Vaccine Information Sheet and instruction to access the V-Safe system.   Ms. Beissel was instructed to call 911 with any severe reactions post vaccine: Marland Kitchen Difficulty breathing  . Swelling of face and throat  . A fast heartbeat  . A bad rash all over body  . Dizziness and weakness   Immunizations Administered    Name Date Dose VIS Date Route   Moderna COVID-19 Vaccine 10/13/2019 11:27 AM 0.5 mL 06/12/2019 Intramuscular   Manufacturer: Moderna   Lot: [id number]   NDC: 59935-701-77

## 2019-10-15 ENCOUNTER — Other Ambulatory Visit: Payer: Self-pay

## 2019-10-15 ENCOUNTER — Ambulatory Visit (INDEPENDENT_AMBULATORY_CARE_PROVIDER_SITE_OTHER): Payer: Commercial Managed Care - PPO | Admitting: Family Medicine

## 2019-10-15 ENCOUNTER — Encounter (INDEPENDENT_AMBULATORY_CARE_PROVIDER_SITE_OTHER): Payer: Self-pay | Admitting: Family Medicine

## 2019-10-15 VITALS — BP 109/77 | HR 74 | Temp 98.1°F | Ht 62.0 in | Wt 181.0 lb

## 2019-10-15 DIAGNOSIS — F3289 Other specified depressive episodes: Secondary | ICD-10-CM | POA: Diagnosis not present

## 2019-10-15 DIAGNOSIS — R7303 Prediabetes: Secondary | ICD-10-CM

## 2019-10-15 DIAGNOSIS — E559 Vitamin D deficiency, unspecified: Secondary | ICD-10-CM

## 2019-10-15 DIAGNOSIS — Z9189 Other specified personal risk factors, not elsewhere classified: Secondary | ICD-10-CM | POA: Diagnosis not present

## 2019-10-15 DIAGNOSIS — Z6833 Body mass index (BMI) 33.0-33.9, adult: Secondary | ICD-10-CM

## 2019-10-15 DIAGNOSIS — E669 Obesity, unspecified: Secondary | ICD-10-CM

## 2019-10-15 MED ORDER — BD PEN NEEDLE NANO 2ND GEN 32G X 4 MM MISC: 1.0000 | Freq: Every day | 0 refills | Status: DC

## 2019-10-15 MED ORDER — SAXENDA 18 MG/3ML ~~LOC~~ SOPN: 3.0000 mg | PEN_INJECTOR | Freq: Every day | SUBCUTANEOUS | 0 refills | Status: DC

## 2019-10-15 MED ORDER — BUPROPION HCL ER (XL) 150 MG PO TB24: 150.0000 mg | ORAL_TABLET | Freq: Every day | ORAL | 0 refills | Status: DC

## 2019-10-15 NOTE — Progress Notes (Signed)
Chief Complaint:   OBESITY [name] is here to discuss her progress with her obesity treatment plan along with follow-up of her obesity related diagnoses. [name] is on the Category 1 Plan and states she is following her eating plan approximately 80% of the time. [name] states she is walking for 30 minutes 2 times per week.  Today's visit was #: 4 Starting weight: 185 lbs Starting date: 08/21/2019 Today's weight: 181 lbs Today's date: 10/15/2019 Total lbs lost to date: 4 lbs Total lbs lost since last in-office visit: 4 lbs  Interim History: [name] is doing intermittent fasting with an eating window from 12pm-8pm.   She provided the following food recall today: Breakfast:  She hated the cereal, but had omellete with mushrooms, [place], ham, cheddar, water. Snack:  Peanut butter with graham crackers. Lunch:  Sandwich (4 ounces of meat was too much, getting 2.5 to 3 ounces) with raisins/grapes. Dinner:  [employer] with protein or Secondary school teacher. ETOH: 2 bottles of wine per week.  Subjective:   1. Vitamin D deficiency [name]'s Vitamin D level was 17.7 on 08/21/2019. She is currently taking prescription vitamin D 50,000 IU each week. She denies nausea, vomiting or muscle weakness.  2. Prediabetes [name] has a diagnosis of prediabetes based on her elevated HgA1c and was informed this puts her at greater risk of developing diabetes. She continues to work on diet and exercise to decrease her risk of diabetes. She denies nausea or hypoglycemia.  Lab Results  Component Value Date   HGBA1C 6.0 (H) 08/21/2019   Lab Results  Component Value Date   INSULIN 12.4 08/21/2019   3. Other depression, with emotional eating [name] is struggling with emotional eating and using food for comfort to the extent that it is negatively impacting her health. She has been working on behavior modification techniques to help reduce her emotional eating and has been unsuccessful. She shows no sign of suicidal or homicidal  ideations.  4. At risk for diabetes mellitus [name] was given approximately 15 minutes of diabetes education and counseling today. We discussed intensive lifestyle modifications today with an emphasis on weight loss as well as increasing exercise and decreasing simple carbohydrates in her diet. We also reviewed medication options with an emphasis on risk versus benefit of those discussed.   Repetitive spaced learning was employed today to elicit superior memory formation and behavioral change.  Assessment/Plan:   1. Vitamin D deficiency Low Vitamin D level contributes to fatigue and are associated with obesity, breast, and colon cancer. She agrees to continue to take prescription Vitamin D @50 ,000 IU every week and will follow-up for routine testing of Vitamin D, at least 2-3 times per year to avoid over-replacement.  2. Prediabetes [name] will continue to work on weight loss, exercise, and decreasing simple carbohydrates to help decrease the risk of diabetes.   3. Other depression, with emotional eating Behavior modification techniques were discussed today to help [name] deal with her emotional/non-hunger eating behaviors.  Orders and follow up as documented in patient record.   Orders - buPROPion (WELLBUTRIN XL) 150 MG 24 hr tablet; Take 1 tablet (150 mg total) by mouth daily.  Dispense: 30 tablet; Refill: 0  4. At risk for diabetes mellitus [name] was given approximately 15 minutes of diabetes education and counseling today. We discussed intensive lifestyle modifications today with an emphasis on weight loss as well as increasing exercise and decreasing simple carbohydrates in her diet. We also reviewed medication options with an emphasis on risk  versus benefit of those discussed.   Repetitive spaced learning was employed today to elicit superior memory formation and behavioral change.  5. Class 1 obesity with serious comorbidity and body mass index (BMI) of 33.0 to 33.9 in adult,  unspecified obesity type Will trial GLP-1 receptor agonist. Clinical data have revealed that these therapies improve glycemic control while reducing body weight and systolic blood pressure in patients. Furthermore, incidence of hypoglycemia is relatively low with these treatments because of their glucose-dependent mechanism of action.   Orders - Liraglutide -Weight Management (SAXENDA) 18 MG/3ML SOPN; Inject 0.5 mLs (3 mg total) into the skin daily. Stay at this dose until next visit.  Dispense: 1 pen; Refill: 0 - Insulin Pen Needle (BD PEN NEEDLE NANO 2ND GEN) 32G X 4 MM MISC; 1 Package by Does not apply route daily.  Dispense: 100 each; Refill: 0  [name] is currently in the action stage of change. As such, her goal is to continue with weight loss efforts. She has agreed to the Category 1 Plan.   Exercise goals: For substantial health benefits, adults should do at least 150 minutes (2 hours and 30 minutes) a week of moderate-intensity, or 75 minutes (1 hour and 15 minutes) a week of vigorous-intensity aerobic physical activity, or an equivalent combination of moderate- and vigorous-intensity aerobic activity. Aerobic activity should be performed in episodes of at least 10 minutes, and preferably, it should be spread throughout the week.  Behavioral modification strategies: increasing lean protein intake and increasing water intake.  [name] has agreed to follow-up with our clinic in 2 weeks. She was informed of the importance of frequent follow-up visits to maximize her success with intensive lifestyle modifications for her multiple health conditions.   Objective:   Blood pressure 109/77, pulse 74, temperature 98.1 F (36.7 C), temperature source Oral, height 5\' 2"  (1.575 m), weight 181 lb (82.1 kg), last menstrual period 08/19/2019, SpO2 97 %. Body mass index is 33.11 kg/m.  General: Cooperative, alert, well developed, in no acute distress. HEENT: Conjunctivae and lids  unremarkable. Cardiovascular: Regular rhythm.  Lungs: Normal work of breathing. Neurologic: No focal deficits.   Lab Results  Component Value Date   CREATININE 0.73 08/21/2019   BUN 8 08/21/2019   NA 136 08/21/2019   K 4.1 08/21/2019   CL 102 08/21/2019   CO2 21 08/21/2019   Lab Results  Component Value Date   ALT 27 08/21/2019   AST 31 08/21/2019   ALKPHOS 60 08/21/2019   BILITOT 0.6 08/21/2019   Lab Results  Component Value Date   HGBA1C 6.0 (H) 08/21/2019   Lab Results  Component Value Date   INSULIN 12.4 08/21/2019   Lab Results  Component Value Date   TSH 2.280 08/21/2019   Lab Results  Component Value Date   CHOL 205 (H) 08/21/2019   HDL 43 08/21/2019   LDLCALC 138 (H) 08/21/2019   TRIG 132 08/21/2019   CHOLHDL 4.8 (H) 08/21/2019   Lab Results  Component Value Date   WBC 9.9 08/21/2019   HGB 14.9 08/21/2019   HCT 44.5 08/21/2019   MCV 84 08/21/2019   PLT 222 08/21/2019   Lab Results  Component Value Date   IRON 117 08/21/2019   TIBC 344 08/21/2019   FERRITIN 220 (H) 08/21/2019   Attestation Statements:   Reviewed by clinician on day of visit: allergies, medications, problem list, medical history, surgical history, family history, social history, and previous encounter notes.  I, Water quality scientist, CMA, am acting  as transcriptionist for Helane Rima, DO.  I have reviewed the above documentation for accuracy and completeness, and I agree with the above. Helane Rima, DO

## 2019-10-16 ENCOUNTER — Encounter (INDEPENDENT_AMBULATORY_CARE_PROVIDER_SITE_OTHER): Payer: Self-pay | Admitting: Family Medicine

## 2019-10-16 NOTE — Telephone Encounter (Signed)
Please advise. Thanks.  

## 2019-10-23 ENCOUNTER — Other Ambulatory Visit (INDEPENDENT_AMBULATORY_CARE_PROVIDER_SITE_OTHER): Payer: Self-pay | Admitting: Family Medicine

## 2019-10-23 DIAGNOSIS — F411 Generalized anxiety disorder: Secondary | ICD-10-CM

## 2019-10-23 MED ORDER — CLONAZEPAM 0.5 MG PO TABS: 0.5000 mg | ORAL_TABLET | Freq: Every day | ORAL | 0 refills | Status: DC

## 2019-10-24 ENCOUNTER — Ambulatory Visit (INDEPENDENT_AMBULATORY_CARE_PROVIDER_SITE_OTHER): Payer: Commercial Managed Care - PPO | Admitting: Psychology

## 2019-10-24 DIAGNOSIS — F4321 Adjustment disorder with depressed mood: Secondary | ICD-10-CM | POA: Diagnosis not present

## 2019-10-30 ENCOUNTER — Other Ambulatory Visit (INDEPENDENT_AMBULATORY_CARE_PROVIDER_SITE_OTHER): Payer: Self-pay | Admitting: Family Medicine

## 2019-10-30 DIAGNOSIS — E559 Vitamin D deficiency, unspecified: Secondary | ICD-10-CM

## 2019-11-05 ENCOUNTER — Ambulatory Visit (INDEPENDENT_AMBULATORY_CARE_PROVIDER_SITE_OTHER): Payer: Commercial Managed Care - PPO | Admitting: Family Medicine

## 2019-11-05 ENCOUNTER — Other Ambulatory Visit: Payer: Self-pay

## 2019-11-05 ENCOUNTER — Encounter (INDEPENDENT_AMBULATORY_CARE_PROVIDER_SITE_OTHER): Payer: Self-pay | Admitting: Family Medicine

## 2019-11-05 VITALS — BP 104/71 | HR 75 | Temp 98.5°F | Ht 62.0 in | Wt 182.0 lb

## 2019-11-05 DIAGNOSIS — E669 Obesity, unspecified: Secondary | ICD-10-CM

## 2019-11-05 DIAGNOSIS — F3289 Other specified depressive episodes: Secondary | ICD-10-CM | POA: Diagnosis not present

## 2019-11-05 DIAGNOSIS — Z6833 Body mass index (BMI) 33.0-33.9, adult: Secondary | ICD-10-CM

## 2019-11-05 DIAGNOSIS — R7303 Prediabetes: Secondary | ICD-10-CM | POA: Diagnosis not present

## 2019-11-05 DIAGNOSIS — E559 Vitamin D deficiency, unspecified: Secondary | ICD-10-CM

## 2019-11-05 DIAGNOSIS — Z9189 Other specified personal risk factors, not elsewhere classified: Secondary | ICD-10-CM

## 2019-11-06 ENCOUNTER — Ambulatory Visit (INDEPENDENT_AMBULATORY_CARE_PROVIDER_SITE_OTHER): Payer: Commercial Managed Care - PPO | Admitting: Psychology

## 2019-11-06 DIAGNOSIS — F4321 Adjustment disorder with depressed mood: Secondary | ICD-10-CM

## 2019-11-06 MED ORDER — VITAMIN D (ERGOCALCIFEROL) 1.25 MG (50000 UNIT) PO CAPS: 50000.0000 [IU] | ORAL_CAPSULE | ORAL | 0 refills | Status: DC

## 2019-11-06 MED ORDER — PHENTERMINE HCL 37.5 MG PO TABS: 18.7500 mg | ORAL_TABLET | Freq: Every morning | ORAL | 0 refills | Status: DC

## 2019-11-06 NOTE — Progress Notes (Signed)
Chief Complaint:   OBESITY [name] is here to discuss her progress with her obesity treatment plan along with follow-up of her obesity related diagnoses. [name] is on the Category 1 Plan and states she is following her eating plan approximately 75% of the time. [name] states she is walking 1 mile 2 times per week.  Today's visit was #: 5 Starting weight: 185 lbs Starting date: 08/21/2019 Today's weight: 182 lbs Today's date: 11/05/2019 Total lbs lost to date: 3 lbs Total lbs lost since last in-office visit: 1 lb  Interim History: [name] says she has some increased stress but she is still moving forward (new house, still with deadline for ex-husband to move out).  It is her daughter's birthday today.  Her daughter is also in cheer competitions, so she is getting used to that.  She admits she has not been eating regularly.  She has had some increased emotional eating and ETOH.  Subjective:   1. Vitamin D deficiency [name]'s Vitamin D level was 17.7 on 08/21/2019. She is currently taking prescription vitamin D 50,000 IU each week. She denies nausea, vomiting or muscle weakness.  2. Prediabetes [name] has a diagnosis of prediabetes based on her elevated HgA1c and was informed this puts her at greater risk of developing diabetes. She continues to work on diet and exercise to decrease her risk of diabetes. She denies nausea or hypoglycemia.  Lab Results  Component Value Date   HGBA1C 6.0 (H) 08/21/2019   Lab Results  Component Value Date   INSULIN 12.4 08/21/2019   3. Other depression, with emotional eating [name] is struggling with emotional eating and using food for comfort to the extent that it is negatively impacting her health. She has been working on behavior modification techniques to help reduce her emotional eating and has been somewhat successful. She shows no sign of suicidal or homicidal ideations.  4. At risk for constipation [name] is at increased risk for constipation due to  inadequate water intake, changes in diet, and/or use of medications such as GLP1 agonists. [name] denies hard, infrequent stools currently.   Assessment/Plan:   1. Vitamin D deficiency Low Vitamin D level contributes to fatigue and are associated with obesity, breast, and colon cancer. She agrees to continue to take prescription Vitamin D @50 ,000 IU every week and will follow-up for routine testing of Vitamin D, at least 2-3 times per year to avoid over-replacement.  Orders - Vitamin D, Ergocalciferol, (DRISDOL) 1.25 MG (50000 UNIT) CAPS capsule; Take 1 capsule (50,000 Units total) by mouth every 7 (seven) days.  Dispense: 4 capsule; Refill: 0  2. Prediabetes [name] will continue to work on weight loss, exercise, and decreasing simple carbohydrates to help decrease the risk of diabetes.   3. Other depression, with emotional eating Behavior modification techniques were discussed today to help [name] deal with her emotional/non-hunger eating behaviors.  Orders and follow up as documented in patient record.  - phentermine (ADIPEX-P) 37.5 MG tablet; Take 0.5 tablets (18.75 mg total) by mouth in the morning.  Dispense: 30 tablet; Refill: 0  4. At risk for constipation [name] was given approximately 15 minutes of counseling today regarding prevention of constipation. She was encouraged to increase water and fiber intake.   5. Class 1 obesity with serious comorbidity and body mass index (BMI) of 33.0 to 33.9 in adult, unspecified obesity type [name] is currently in the action stage of change. As such, her goal is to continue with weight loss efforts. She has  agreed to the Category 1 Plan.   Exercise goals: For substantial health benefits, adults should do at least 150 minutes (2 hours and 30 minutes) a week of moderate-intensity, or 75 minutes (1 hour and 15 minutes) a week of vigorous-intensity aerobic physical activity, or an equivalent combination of moderate- and vigorous-intensity aerobic activity.  Aerobic activity should be performed in episodes of at least 10 minutes, and preferably, it should be spread throughout the week.  Behavioral modification strategies: increasing lean protein intake and increasing water intake.  [name] has agreed to follow-up with our clinic in 2 weeks. She was informed of the importance of frequent follow-up visits to maximize her success with intensive lifestyle modifications for her multiple health conditions.   Objective:   Blood pressure 104/71, pulse 75, temperature 98.5 F (36.9 C), temperature source Oral, height 5\' 2"  (1.575 m), weight 182 lb (82.6 kg), last menstrual period 08/20/2019, SpO2 98 %. Body mass index is 33.29 kg/m.  General: Cooperative, alert, well developed, in no acute distress. HEENT: Conjunctivae and lids unremarkable. Cardiovascular: Regular rhythm.  Lungs: Normal work of breathing. Neurologic: No focal deficits.   Lab Results  Component Value Date   CREATININE 0.73 08/21/2019   BUN 8 08/21/2019   NA 136 08/21/2019   K 4.1 08/21/2019   CL 102 08/21/2019   CO2 21 08/21/2019   Lab Results  Component Value Date   ALT 27 08/21/2019   AST 31 08/21/2019   ALKPHOS 60 08/21/2019   BILITOT 0.6 08/21/2019   Lab Results  Component Value Date   HGBA1C 6.0 (H) 08/21/2019   Lab Results  Component Value Date   INSULIN 12.4 08/21/2019   Lab Results  Component Value Date   TSH 2.280 08/21/2019   Lab Results  Component Value Date   CHOL 205 (H) 08/21/2019   HDL 43 08/21/2019   LDLCALC 138 (H) 08/21/2019   TRIG 132 08/21/2019   CHOLHDL 4.8 (H) 08/21/2019   Lab Results  Component Value Date   WBC 9.9 08/21/2019   HGB 14.9 08/21/2019   HCT 44.5 08/21/2019   MCV 84 08/21/2019   PLT 222 08/21/2019   Lab Results  Component Value Date   IRON 117 08/21/2019   TIBC 344 08/21/2019   FERRITIN 220 (H) 08/21/2019   Attestation Statements:   Reviewed by clinician on day of visit: allergies, medications, problem list,  medical history, surgical history, family history, social history, and previous encounter notes.  I, Water quality scientist, CMA, am acting as Location manager for [employer], DO.  I have reviewed the above documentation for accuracy and completeness, and I agree with the above. Briscoe Deutscher, DO

## 2019-11-10 ENCOUNTER — Ambulatory Visit: Payer: Commercial Managed Care - PPO | Attending: Critical Care Medicine

## 2019-11-10 DIAGNOSIS — Z23 Encounter for immunization: Secondary | ICD-10-CM

## 2019-11-10 NOTE — Progress Notes (Signed)
   Covid-19 Vaccination Clinic  Name:  [name]    MRN: [id number] DOB: Sep 13, 1970  11/10/2019  [name] was observed post Covid-19 immunization for 30 minutes based on pre-vaccination screening without incident. She was provided with Vaccine Information Sheet and instruction to access the V-Safe system.   [name] was instructed to call 911 with any severe reactions post vaccine: Marland Kitchen Difficulty breathing  . Swelling of face and throat  . A fast heartbeat  . A bad rash all over body  . Dizziness and weakness   Immunizations Administered    Name Date Dose VIS Date Route   Moderna COVID-19 Vaccine 11/10/2019 11:10 AM 0.5 mL 06/2019 Intramuscular   Manufacturer: Moderna   Lot: [id number]   NDC: 48403-979-53

## 2019-11-21 ENCOUNTER — Encounter (INDEPENDENT_AMBULATORY_CARE_PROVIDER_SITE_OTHER): Payer: Self-pay | Admitting: Family Medicine

## 2019-11-21 ENCOUNTER — Other Ambulatory Visit: Payer: Self-pay

## 2019-11-21 ENCOUNTER — Ambulatory Visit (INDEPENDENT_AMBULATORY_CARE_PROVIDER_SITE_OTHER): Payer: Commercial Managed Care - PPO | Admitting: Psychology

## 2019-11-21 ENCOUNTER — Ambulatory Visit (INDEPENDENT_AMBULATORY_CARE_PROVIDER_SITE_OTHER): Payer: Commercial Managed Care - PPO | Admitting: Family Medicine

## 2019-11-21 VITALS — BP 142/92 | HR 95 | Temp 98.2°F | Ht 62.0 in | Wt 177.0 lb

## 2019-11-21 DIAGNOSIS — Z6832 Body mass index (BMI) 32.0-32.9, adult: Secondary | ICD-10-CM

## 2019-11-21 DIAGNOSIS — E669 Obesity, unspecified: Secondary | ICD-10-CM | POA: Diagnosis not present

## 2019-11-21 DIAGNOSIS — Z9189 Other specified personal risk factors, not elsewhere classified: Secondary | ICD-10-CM | POA: Diagnosis not present

## 2019-11-21 DIAGNOSIS — F3289 Other specified depressive episodes: Secondary | ICD-10-CM

## 2019-11-21 DIAGNOSIS — F4321 Adjustment disorder with depressed mood: Secondary | ICD-10-CM

## 2019-11-21 DIAGNOSIS — E559 Vitamin D deficiency, unspecified: Secondary | ICD-10-CM | POA: Diagnosis not present

## 2019-11-21 MED ORDER — PHENTERMINE HCL 37.5 MG PO TABS: 18.7500 mg | ORAL_TABLET | Freq: Every morning | ORAL | 0 refills | Status: DC

## 2019-11-21 MED ORDER — LABETALOL HCL 300 MG PO TABS: 300.0000 mg | ORAL_TABLET | Freq: Two times a day (BID) | ORAL | 2 refills | Status: DC

## 2019-11-21 MED ORDER — VITAMIN D (ERGOCALCIFEROL) 1.25 MG (50000 UNIT) PO CAPS: 50000.0000 [IU] | ORAL_CAPSULE | ORAL | 0 refills | Status: DC

## 2019-11-21 MED ORDER — BUPROPION HCL ER (XL) 150 MG PO TB24: 150.0000 mg | ORAL_TABLET | Freq: Every day | ORAL | 0 refills | Status: DC

## 2019-11-21 NOTE — Telephone Encounter (Signed)
Pt's medication was sent to pt's pharmacy as requested. Confirmation received.  °

## 2019-11-21 NOTE — Progress Notes (Signed)
Chief Complaint:   OBESITY [name] is here to discuss her progress with her obesity treatment plan along with follow-up of her obesity related diagnoses. [name] is on the Category 1 Plan and states she is following her eating plan approximately 85% of the time. [name] states she is exercising for 0 minutes 0 times per week.  Today's visit was #: 6 Starting weight: 185 lbs Starting date: 08/21/2019 Today's weight: 177 lbs Today's date: 11/21/2019 Total lbs lost to date: 8 lbs Total lbs lost since last in-office visit: 5 lbs  Interim History: [name] says that she has a June goal of being dedicated to exercise at least 3 times a week.  She is still in the process of moving.  Still working with Psychology.  Subjective:   1. Vitamin D deficiency [name]'s Vitamin D level was 17.7 on 08/21/2019. She is currently taking prescription vitamin D 50,000 IU each week. She denies nausea, vomiting or muscle weakness.  2. Other depression, with emotional eating [name] is taking Wellbutrin and phentermine and says that both are working well.  3. At risk for constipation [name] is at increased risk for constipation due to inadequate water intake, changes in diet, and/or use of medications such as GLP1 agonists. [name] denies hard, infrequent stools currently.   Assessment/Plan:   1. Vitamin D deficiency Low Vitamin D level contributes to fatigue and are associated with obesity, breast, and colon cancer. She agrees to continue to take prescription Vitamin D @50 ,000 IU every week and will follow-up for routine testing of Vitamin D, at least 2-3 times per year to avoid over-replacement.  Orders - Vitamin D, Ergocalciferol, (DRISDOL) 1.25 MG (50000 UNIT) CAPS capsule; Take 1 capsule (50,000 Units total) by mouth every 7 (seven) days.  Dispense: 4 capsule; Refill: 0  2. Other depression, with emotional eating Behavior modification techniques were discussed today to help [name] deal with her emotional/non-hunger  eating behaviors.  Orders and follow up as documented in patient record.  Wellbutrin and phentermine will both be refilled today.   Orders - phentermine (ADIPEX-P) 37.5 MG tablet; Take 0.5 tablets (18.75 mg total) by mouth in the morning.  Dispense: 30 tablet; Refill: 0 - buPROPion (WELLBUTRIN XL) 150 MG 24 hr tablet; Take 1 tablet (150 mg total) by mouth daily.  Dispense: 30 tablet; Refill: 0  3. At risk for constipation [name] was given approximately 15 minutes of counseling today regarding prevention of constipation. She was encouraged to increase water and fiber intake.   4. Class 1 obesity with serious comorbidity and body mass index (BMI) of 32.0 to 32.9 in adult, unspecified obesity type [name] is currently in the action stage of change. As such, her goal is to continue with weight loss efforts. She has agreed to the Category 1 Plan.   Exercise goals: For substantial health benefits, adults should do at least 150 minutes (2 hours and 30 minutes) a week of moderate-intensity, or 75 minutes (1 hour and 15 minutes) a week of vigorous-intensity aerobic physical activity, or an equivalent combination of moderate- and vigorous-intensity aerobic activity. Aerobic activity should be performed in episodes of at least 10 minutes, and preferably, it should be spread throughout the week.  Behavioral modification strategies: increasing lean protein intake and increasing water intake.  [name] has agreed to follow-up with our clinic in 2 weeks. She was informed of the importance of frequent follow-up visits to maximize her success with intensive lifestyle modifications for her multiple health conditions.   Objective:  Blood pressure (!) 142/92, pulse 95, temperature 98.2 F (36.8 C), temperature source Oral, height 5\' 2"  (1.575 m), weight 177 lb (80.3 kg), last menstrual period 08/18/2019, SpO2 99 %. Body mass index is 32.37 kg/m.  General: Cooperative, alert, well developed, in no acute  distress. HEENT: Conjunctivae and lids unremarkable. Cardiovascular: Regular rhythm.  Lungs: Normal work of breathing. Neurologic: No focal deficits.   Lab Results  Component Value Date   CREATININE 0.73 08/21/2019   BUN 8 08/21/2019   NA 136 08/21/2019   K 4.1 08/21/2019   CL 102 08/21/2019   CO2 21 08/21/2019   Lab Results  Component Value Date   ALT 27 08/21/2019   AST 31 08/21/2019   ALKPHOS 60 08/21/2019   BILITOT 0.6 08/21/2019   Lab Results  Component Value Date   HGBA1C 6.0 (H) 08/21/2019   Lab Results  Component Value Date   INSULIN 12.4 08/21/2019   Lab Results  Component Value Date   TSH 2.280 08/21/2019   Lab Results  Component Value Date   CHOL 205 (H) 08/21/2019   HDL 43 08/21/2019   LDLCALC 138 (H) 08/21/2019   TRIG 132 08/21/2019   CHOLHDL 4.8 (H) 08/21/2019   Lab Results  Component Value Date   WBC 9.9 08/21/2019   HGB 14.9 08/21/2019   HCT 44.5 08/21/2019   MCV 84 08/21/2019   PLT 222 08/21/2019   Lab Results  Component Value Date   IRON 117 08/21/2019   TIBC 344 08/21/2019   FERRITIN 220 (H) 08/21/2019   Attestation Statements:   Reviewed by clinician on day of visit: allergies, medications, problem list, medical history, surgical history, family history, social history, and previous encounter notes.  I, Water quality scientist, CMA, am acting as Location manager for [employer], DO.  I have reviewed the above documentation for accuracy and completeness, and I agree with the above. Briscoe Deutscher, DO

## 2019-11-22 ENCOUNTER — Other Ambulatory Visit: Payer: Self-pay | Admitting: Family Medicine

## 2019-11-22 ENCOUNTER — Encounter (INDEPENDENT_AMBULATORY_CARE_PROVIDER_SITE_OTHER): Payer: Self-pay | Admitting: Family Medicine

## 2019-11-22 ENCOUNTER — Other Ambulatory Visit: Payer: Self-pay

## 2019-11-22 MED ORDER — PHENTERMINE HCL 37.5 MG PO TABS: 37.5000 mg | ORAL_TABLET | Freq: Every day | ORAL | 0 refills | Status: DC

## 2019-11-22 MED ORDER — SPIRONOLACTONE 50 MG PO TABS: 50.0000 mg | ORAL_TABLET | Freq: Every day | ORAL | 0 refills | Status: DC

## 2019-11-22 NOTE — Telephone Encounter (Signed)
Please advise. Thanks.  

## 2019-11-22 NOTE — Telephone Encounter (Signed)
Pt's medication was sent to local pharmacy for a 30 day supply until mail order arrives. Confirmation received.

## 2019-11-23 ENCOUNTER — Other Ambulatory Visit: Payer: Self-pay | Admitting: Family Medicine

## 2019-11-23 NOTE — Telephone Encounter (Signed)
Request sent to the wrong que.  Please Advise.

## 2019-11-24 MED ORDER — PHENTERMINE HCL 37.5 MG PO TABS: 37.5000 mg | ORAL_TABLET | Freq: Every day | ORAL | 0 refills | Status: DC

## 2019-11-26 ENCOUNTER — Other Ambulatory Visit (INDEPENDENT_AMBULATORY_CARE_PROVIDER_SITE_OTHER): Payer: Self-pay

## 2019-11-26 DIAGNOSIS — Z6833 Body mass index (BMI) 33.0-33.9, adult: Secondary | ICD-10-CM

## 2019-11-26 MED ORDER — SAXENDA 18 MG/3ML ~~LOC~~ SOPN: 3.0000 mg | PEN_INJECTOR | Freq: Every day | SUBCUTANEOUS | 0 refills | Status: DC

## 2019-11-26 MED ORDER — PHENTERMINE HCL 37.5 MG PO TABS: 37.5000 mg | ORAL_TABLET | Freq: Every day | ORAL | 0 refills | Status: DC

## 2019-11-26 NOTE — Telephone Encounter (Signed)
Please advise 

## 2019-12-05 ENCOUNTER — Ambulatory Visit (INDEPENDENT_AMBULATORY_CARE_PROVIDER_SITE_OTHER): Payer: Commercial Managed Care - PPO | Admitting: Psychology

## 2019-12-05 DIAGNOSIS — F4321 Adjustment disorder with depressed mood: Secondary | ICD-10-CM

## 2019-12-13 ENCOUNTER — Other Ambulatory Visit (INDEPENDENT_AMBULATORY_CARE_PROVIDER_SITE_OTHER): Payer: Self-pay | Admitting: Family Medicine

## 2019-12-13 DIAGNOSIS — E559 Vitamin D deficiency, unspecified: Secondary | ICD-10-CM

## 2019-12-14 ENCOUNTER — Ambulatory Visit: Payer: Commercial Managed Care - PPO | Admitting: Physician Assistant

## 2019-12-15 ENCOUNTER — Other Ambulatory Visit: Payer: Self-pay | Admitting: Cardiovascular Disease

## 2019-12-17 NOTE — Telephone Encounter (Signed)
Got refill request from [facility] for Spironolactone 50 mg daily and called pt because this was supposed to be one time refill at [facility] till she received medication from mail order pharmacy. Patient confirmed this and that she has plenty of medication and does not need any at this time and it is being filled by mail order pharmacy. Patient thanked me for my call.

## 2019-12-19 ENCOUNTER — Ambulatory Visit: Payer: Commercial Managed Care - PPO | Admitting: Psychology

## 2019-12-20 ENCOUNTER — Ambulatory Visit (INDEPENDENT_AMBULATORY_CARE_PROVIDER_SITE_OTHER): Payer: Commercial Managed Care - PPO | Admitting: Family Medicine

## 2019-12-20 ENCOUNTER — Ambulatory Visit: Payer: Commercial Managed Care - PPO | Admitting: Psychology

## 2019-12-24 ENCOUNTER — Ambulatory Visit (INDEPENDENT_AMBULATORY_CARE_PROVIDER_SITE_OTHER): Payer: Commercial Managed Care - PPO | Admitting: Adult Health

## 2019-12-31 ENCOUNTER — Encounter (INDEPENDENT_AMBULATORY_CARE_PROVIDER_SITE_OTHER): Payer: Self-pay | Admitting: Family Medicine

## 2019-12-31 ENCOUNTER — Other Ambulatory Visit: Payer: Self-pay

## 2019-12-31 ENCOUNTER — Ambulatory Visit (INDEPENDENT_AMBULATORY_CARE_PROVIDER_SITE_OTHER): Payer: Commercial Managed Care - PPO | Admitting: Family Medicine

## 2019-12-31 VITALS — BP 109/75 | HR 85 | Temp 97.6°F | Ht 62.0 in | Wt 176.0 lb

## 2019-12-31 DIAGNOSIS — M25561 Pain in right knee: Secondary | ICD-10-CM

## 2019-12-31 DIAGNOSIS — F419 Anxiety disorder, unspecified: Secondary | ICD-10-CM | POA: Diagnosis not present

## 2019-12-31 DIAGNOSIS — G8929 Other chronic pain: Secondary | ICD-10-CM

## 2019-12-31 DIAGNOSIS — Z9189 Other specified personal risk factors, not elsewhere classified: Secondary | ICD-10-CM

## 2019-12-31 DIAGNOSIS — F3289 Other specified depressive episodes: Secondary | ICD-10-CM

## 2019-12-31 DIAGNOSIS — E669 Obesity, unspecified: Secondary | ICD-10-CM

## 2019-12-31 DIAGNOSIS — Z6832 Body mass index (BMI) 32.0-32.9, adult: Secondary | ICD-10-CM

## 2019-12-31 DIAGNOSIS — E559 Vitamin D deficiency, unspecified: Secondary | ICD-10-CM | POA: Diagnosis not present

## 2019-12-31 MED ORDER — CLONAZEPAM 0.5 MG PO TABS: 0.5000 mg | ORAL_TABLET | Freq: Every day | ORAL | 0 refills | Status: DC

## 2019-12-31 MED ORDER — PHENTERMINE HCL 37.5 MG PO TABS: 37.5000 mg | ORAL_TABLET | Freq: Every day | ORAL | 0 refills | Status: DC

## 2019-12-31 MED ORDER — BUPROPION HCL ER (XL) 150 MG PO TB24: 150.0000 mg | ORAL_TABLET | Freq: Every day | ORAL | 0 refills | Status: DC

## 2019-12-31 MED ORDER — VITAMIN D (ERGOCALCIFEROL) 1.25 MG (50000 UNIT) PO CAPS: 50000.0000 [IU] | ORAL_CAPSULE | ORAL | 0 refills | Status: DC

## 2019-12-31 NOTE — Progress Notes (Signed)
Chief Complaint:   OBESITY [name] is here to discuss her progress with her obesity treatment plan along with follow-up of her obesity related diagnoses. [name] is on the Category 1 Plan and states she is following her eating plan approximately 60% of the time. [name] states she is exercising for 0 minutes 0 times per week.  Today's visit was #: 7 Starting weight: 185 lbs Starting date: 08/21/2019 Today's weight: 176 lbs Today's date: 12/31/2019 Total lbs lost to date: 9 lbs Total lbs lost since last in-office visit: 1 lb  Interim History: [name] just got back from [place].  She was there for 6 days.  She says that [employer].  She put her house on the market and it sold.  Closing is on the 16th.  Subjective:   1. Vitamin D deficiency [name]'s Vitamin D level was 17.7 on 08/21/2019. She is currently taking prescription vitamin D 50,000 IU each week. She denies nausea, vomiting or muscle weakness.  2. Anxiety [name] takes Klonopin as needed for anxiety.  3. Chronic pain of right knee [name] has chronic pain of the right knee that does hinder exercise to some degree.  4. Other depression, with emotional eating [name] is taking Wellbutrin and phentermine for her depression with emotional eating.  5. At risk for deficient intake of food The patient is at a higher than average risk of deficient intake of food.  Assessment/Plan:   1. Vitamin D deficiency Low Vitamin D level contributes to fatigue and are associated with obesity, breast, and colon cancer. She agrees to continue to take prescription Vitamin D @50 ,000 IU every week and will follow-up for routine testing of Vitamin D, at least 2-3 times per year to avoid over-replacement.  Orders - Vitamin D, Ergocalciferol, (DRISDOL) 1.25 MG (50000 UNIT) CAPS capsule; Take 1 capsule (50,000 Units total) by mouth every 7 (seven) days.  Dispense: 4 capsule; Refill: 0  2. Anxiety Current treatment plan is effective, no change in therapy.     Orders - clonazePAM (KLONOPIN) 0.5 MG tablet; Take 1-2 tablets (0.5-1 mg total) by mouth daily in the afternoon.  Dispense: 20 tablet; Refill: 0  3. Chronic pain of right knee Will follow because mobility and pain control are important for weight management.  4. Other depression, with emotional eating Behavior modification techniques were discussed today to help [name] deal with her emotional/non-hunger eating behaviors.  Orders and follow up as documented in patient record.   Orders - phentermine (ADIPEX-P) 37.5 MG tablet; Take 1 tablet (37.5 mg total) by mouth daily before breakfast.  Dispense: 30 tablet; Refill: 0 - buPROPion (WELLBUTRIN XL) 150 MG 24 hr tablet; Take 1 tablet (150 mg total) by mouth daily.  Dispense: 30 tablet; Refill: 0  5. At risk for deficient intake of food [name] was given approximately 15 minutes of deficit intake of food prevention counseling today. [name] is at risk for eating too few calories based on current food recall. She was encouraged to focus on meeting caloric and protein goals according to her recommended meal plan.   6. Class 1 obesity with serious comorbidity and body mass index (BMI) of 32.0 to 32.9 in adult, unspecified obesity type [name] is currently in the action stage of change. As such, her goal is to continue with weight loss efforts. She has agreed to the Category 1 Plan.   Exercise goals: For substantial health benefits, adults should do at least 150 minutes (2 hours and 30 minutes) a week of moderate-intensity,  or 75 minutes (1 hour and 15 minutes) a week of vigorous-intensity aerobic physical activity, or an equivalent combination of moderate- and vigorous-intensity aerobic activity. Aerobic activity should be performed in episodes of at least 10 minutes, and preferably, it should be spread throughout the week.  Behavioral modification strategies: increasing lean protein intake, decreasing simple carbohydrates, increasing water intake and  decreasing alcohol intake.  [name] has agreed to follow-up with our clinic in 3 weeks. She was informed of the importance of frequent follow-up visits to maximize her success with intensive lifestyle modifications for her multiple health conditions.   Objective:   Blood pressure 109/75, pulse 85, temperature 97.6 F (36.4 C), temperature source Oral, height 5\' 2"  (1.575 m), weight 176 lb (79.8 kg), SpO2 98 %. Body mass index is 32.19 kg/m.  General: Cooperative, alert, well developed, in no acute distress. HEENT: Conjunctivae and lids unremarkable. Cardiovascular: Regular rhythm.  Lungs: Normal work of breathing. Neurologic: No focal deficits.   Lab Results  Component Value Date   CREATININE 0.73 08/21/2019   BUN 8 08/21/2019   NA 136 08/21/2019   K 4.1 08/21/2019   CL 102 08/21/2019   CO2 21 08/21/2019   Lab Results  Component Value Date   ALT 27 08/21/2019   AST 31 08/21/2019   ALKPHOS 60 08/21/2019   BILITOT 0.6 08/21/2019   Lab Results  Component Value Date   HGBA1C 6.0 (H) 08/21/2019   Lab Results  Component Value Date   INSULIN 12.4 08/21/2019   Lab Results  Component Value Date   TSH 2.280 08/21/2019   Lab Results  Component Value Date   CHOL 205 (H) 08/21/2019   HDL 43 08/21/2019   LDLCALC 138 (H) 08/21/2019   TRIG 132 08/21/2019   CHOLHDL 4.8 (H) 08/21/2019   Lab Results  Component Value Date   WBC 9.9 08/21/2019   HGB 14.9 08/21/2019   HCT 44.5 08/21/2019   MCV 84 08/21/2019   PLT 222 08/21/2019   Lab Results  Component Value Date   IRON 117 08/21/2019   TIBC 344 08/21/2019   FERRITIN 220 (H) 08/21/2019   Attestation Statements:   Reviewed by clinician on day of visit: allergies, medications, problem list, medical history, surgical history, family history, social history, and previous encounter notes.  I, Water quality scientist, CMA, am acting as transcriptionist for Briscoe Deutscher, DO  I have reviewed the above documentation for accuracy and  completeness, and I agree with the above. Briscoe Deutscher, DO

## 2020-01-02 ENCOUNTER — Ambulatory Visit (INDEPENDENT_AMBULATORY_CARE_PROVIDER_SITE_OTHER): Payer: Commercial Managed Care - PPO | Admitting: Psychology

## 2020-01-02 DIAGNOSIS — F4321 Adjustment disorder with depressed mood: Secondary | ICD-10-CM

## 2020-01-14 ENCOUNTER — Other Ambulatory Visit (INDEPENDENT_AMBULATORY_CARE_PROVIDER_SITE_OTHER): Payer: Self-pay | Admitting: Family Medicine

## 2020-01-14 DIAGNOSIS — E559 Vitamin D deficiency, unspecified: Secondary | ICD-10-CM

## 2020-01-16 ENCOUNTER — Ambulatory Visit: Payer: Commercial Managed Care - PPO | Admitting: Physician Assistant

## 2020-01-18 ENCOUNTER — Ambulatory Visit (INDEPENDENT_AMBULATORY_CARE_PROVIDER_SITE_OTHER): Payer: Commercial Managed Care - PPO | Admitting: Psychology

## 2020-01-18 DIAGNOSIS — F4321 Adjustment disorder with depressed mood: Secondary | ICD-10-CM

## 2020-01-23 ENCOUNTER — Encounter (INDEPENDENT_AMBULATORY_CARE_PROVIDER_SITE_OTHER): Payer: Self-pay | Admitting: Family Medicine

## 2020-01-23 ENCOUNTER — Other Ambulatory Visit: Payer: Self-pay

## 2020-01-23 ENCOUNTER — Ambulatory Visit (INDEPENDENT_AMBULATORY_CARE_PROVIDER_SITE_OTHER): Payer: Commercial Managed Care - PPO | Admitting: Family Medicine

## 2020-01-23 VITALS — BP 126/87 | HR 74 | Temp 98.3°F | Ht 62.0 in | Wt 173.0 lb

## 2020-01-23 DIAGNOSIS — E669 Obesity, unspecified: Secondary | ICD-10-CM

## 2020-01-23 DIAGNOSIS — R7303 Prediabetes: Secondary | ICD-10-CM

## 2020-01-23 DIAGNOSIS — Z6831 Body mass index (BMI) 31.0-31.9, adult: Secondary | ICD-10-CM

## 2020-01-23 DIAGNOSIS — E7849 Other hyperlipidemia: Secondary | ICD-10-CM | POA: Diagnosis not present

## 2020-01-23 DIAGNOSIS — E559 Vitamin D deficiency, unspecified: Secondary | ICD-10-CM

## 2020-01-23 DIAGNOSIS — F3289 Other specified depressive episodes: Secondary | ICD-10-CM | POA: Diagnosis not present

## 2020-01-23 DIAGNOSIS — Z9189 Other specified personal risk factors, not elsewhere classified: Secondary | ICD-10-CM

## 2020-01-23 NOTE — Progress Notes (Signed)
Chief Complaint:   OBESITY [name] is here to discuss her progress with her obesity treatment plan along with follow-up of her obesity related diagnoses. [name] is on the Category 1 Plan and states she is following her eating plan approximately 75% of the time. [name] states she has been moving.  Today's visit was #: 8 Starting weight: 185 lbs Starting date: 08/21/2019 Today's weight: 173 lbs Today's date: 01/23/2020 Total lbs lost to date: 12 lbs Total lbs lost since last in-office visit: 3 lbs  Interim History: [name] is possibly having some hypoglycemia in the morning.  She says that a Special K bar and [employer] milk helps.  She is working on decreasing her wine intake.  She says she is open to working with Dr. Dewaine Conger.  Subjective:   1. Vitamin D deficiency [name]'s Vitamin D level was 17.7 on 08/21/2019. She is currently taking prescription vitamin D 50,000 IU each week. She denies nausea, vomiting or muscle weakness.  2. Prediabetes [name] has a diagnosis of prediabetes based on her elevated HgA1c and was informed this puts her at greater risk of developing diabetes. She continues to work on diet and exercise to decrease her risk of diabetes. She denies nausea or hypoglycemia.  Lab Results  Component Value Date   HGBA1C 6.0 (H) 08/21/2019   Lab Results  Component Value Date   INSULIN 12.4 08/21/2019   3. Other hyperlipidemia [name] has hyperlipidemia and has been trying to improve her cholesterol levels with intensive lifestyle modification including a low saturated fat diet, exercise and weight loss. She denies any chest pain, claudication or myalgias.  Lab Results  Component Value Date   ALT 27 08/21/2019   AST 31 08/21/2019   ALKPHOS 60 08/21/2019   BILITOT 0.6 08/21/2019   Lab Results  Component Value Date   CHOL 205 (H) 08/21/2019   HDL 43 08/21/2019   LDLCALC 138 (H) 08/21/2019   TRIG 132 08/21/2019   CHOLHDL 4.8 (H) 08/21/2019   4. Other depression, with  emotional eating [name] is struggling with emotional eating and using food for comfort to the extent that it is negatively impacting her health. She has been working on behavior modification techniques to help reduce her emotional eating and has been somewhat successful. She shows no sign of suicidal or homicidal ideations.  Assessment/Plan:   1. Vitamin D deficiency Low Vitamin D level contributes to fatigue and are associated with obesity, breast, and colon cancer. She agrees to continue to take prescription Vitamin D @50 ,000 IU every week and will follow-up for routine testing of Vitamin D, at least 2-3 times per year to avoid over-replacement.  2. Prediabetes [name] will continue to work on weight loss, exercise, and decreasing simple carbohydrates to help decrease the risk of diabetes.   3. Other hyperlipidemia Cardiovascular risk and specific lipid/LDL goals reviewed.  We discussed several lifestyle modifications today and [name] will continue to work on diet, exercise and weight loss efforts. Orders and follow up as documented in patient record.   Counseling Intensive lifestyle modifications are the first line treatment for this issue. . Dietary changes: Increase soluble fiber. Decrease simple carbohydrates. . Exercise changes: Moderate to vigorous-intensity aerobic activity 150 minutes per week if tolerated. . Lipid-lowering medications: see documented in medical record.  4. Other depression, with emotional eating Behavior modification techniques were discussed today to help [name] deal with her emotional/non-hunger eating behaviors.  Orders and follow up as documented in patient record.   5. At risk  for heart disease [name] was given approximately 15 minutes of coronary artery disease prevention counseling today. She is 49 y.o. female and has risk factors for heart disease including obesity. We discussed intensive lifestyle modifications today with an emphasis on specific weight loss  instructions and strategies.   Repetitive spaced learning was employed today to elicit superior memory formation and behavioral change.  6. Class 1 obesity with serious comorbidity and body mass index (BMI) of 31.0 to 31.9 in adult, unspecified obesity type [name] is currently in the action stage of change. As such, her goal is to continue with weight loss efforts. She has agreed to the Category 1 Plan.   Exercise goals: For substantial health benefits, adults should do at least 150 minutes (2 hours and 30 minutes) a week of moderate-intensity, or 75 minutes (1 hour and 15 minutes) a week of vigorous-intensity aerobic physical activity, or an equivalent combination of moderate- and vigorous-intensity aerobic activity. Aerobic activity should be performed in episodes of at least 10 minutes, and preferably, it should be spread throughout the week.  Behavioral modification strategies: increasing lean protein intake and decreasing alcohol intake.  [name] has agreed to follow-up with our clinic in 3 weeks. She was informed of the importance of frequent follow-up visits to maximize her success with intensive lifestyle modifications for her multiple health conditions.   [name] was informed we would discuss her lab results at her next visit unless there is a critical issue that needs to be addressed sooner. [name] agreed to keep her next visit at the agreed upon time to discuss these results.  Orders Placed This Encounter  Procedures  . Comprehensive metabolic panel  . CBC with Differential/Platelet  . Hemoglobin A1c  . Lipid panel  . VITAMIN D 25 Hydroxy (Vit-D Deficiency, Fractures)  . Anemia panel   Objective:   Blood pressure 126/87, pulse 74, temperature 98.3 F (36.8 C), temperature source Oral, height 5\' 2"  (1.575 m), weight 173 lb (78.5 kg), SpO2 97 %. Body mass index is 31.64 kg/m.  General: Cooperative, alert, well developed, in no acute distress. HEENT: Conjunctivae and lids  unremarkable. Cardiovascular: Regular rhythm.  Lungs: Normal work of breathing. Neurologic: No focal deficits.   Lab Results  Component Value Date   CREATININE 0.73 08/21/2019   BUN 8 08/21/2019   NA 136 08/21/2019   K 4.1 08/21/2019   CL 102 08/21/2019   CO2 21 08/21/2019   Lab Results  Component Value Date   ALT 27 08/21/2019   AST 31 08/21/2019   ALKPHOS 60 08/21/2019   BILITOT 0.6 08/21/2019   Lab Results  Component Value Date   HGBA1C 6.0 (H) 08/21/2019   Lab Results  Component Value Date   INSULIN 12.4 08/21/2019   Lab Results  Component Value Date   TSH 2.280 08/21/2019   Lab Results  Component Value Date   CHOL 205 (H) 08/21/2019   HDL 43 08/21/2019   LDLCALC 138 (H) 08/21/2019   TRIG 132 08/21/2019   CHOLHDL 4.8 (H) 08/21/2019   Lab Results  Component Value Date   WBC 9.9 08/21/2019   HGB 14.9 08/21/2019   HCT 44.5 08/21/2019   MCV 84 08/21/2019   PLT 222 08/21/2019   Lab Results  Component Value Date   IRON 117 08/21/2019   TIBC 344 08/21/2019   FERRITIN 220 (H) 08/21/2019   Attestation Statements:   Reviewed by clinician on day of visit: allergies, medications, problem list, medical history, surgical history, family history, social  history, and previous encounter notes.  I, Insurance claims handler, CMA, am acting as transcriptionist for Helane Rima, DO  I have reviewed the above documentation for accuracy and completeness, and I agree with the above. Helane Rima, DO

## 2020-01-24 LAB — CBC WITH DIFFERENTIAL/PLATELET
Basophils Absolute: 0 10*3/uL (ref 0.0–0.2)
Basos: 0 %
EOS (ABSOLUTE): 0.4 10*3/uL (ref 0.0–0.4)
Eos: 6 %
Hemoglobin: 16.8 g/dL — ABNORMAL HIGH (ref 11.1–15.9)
Immature Grans (Abs): 0 10*3/uL (ref 0.0–0.1)
Immature Granulocytes: 0 %
Lymphocytes Absolute: 2.4 10*3/uL (ref 0.7–3.1)
Lymphs: 31 %
MCH: 28.9 pg (ref 26.6–33.0)
MCHC: 34.1 g/dL (ref 31.5–35.7)
MCV: 85 fL (ref 79–97)
Monocytes Absolute: 0.6 10*3/uL (ref 0.1–0.9)
Monocytes: 8 %
Neutrophils Absolute: 4.2 10*3/uL (ref 1.4–7.0)
Neutrophils: 55 %
Platelets: 221 10*3/uL (ref 150–450)
RBC: 5.82 x10E6/uL — ABNORMAL HIGH (ref 3.77–5.28)
RDW: 13.7 % (ref 11.7–15.4)
WBC: 7.7 10*3/uL (ref 3.4–10.8)

## 2020-01-24 LAB — ANEMIA PANEL
Ferritin: 379 ng/mL — ABNORMAL HIGH (ref 15–150)
Folate, Hemolysate: 379 ng/mL
Folate, RBC: 770 ng/mL (ref >498)
Hematocrit: 49.2 % — ABNORMAL HIGH (ref 34.0–46.6)
Iron Saturation: 25 % (ref 15–55)
Iron: 91 ug/dL (ref 27–159)
Retic Ct Pct: 1.3 % (ref 0.6–2.6)
Total Iron Binding Capacity: 368 ug/dL (ref 250–450)
UIBC: 277 ug/dL (ref 131–425)
Vitamin B-12: 1628 pg/mL — ABNORMAL HIGH (ref 232–1245)

## 2020-01-24 LAB — LIPID PANEL
Chol/HDL Ratio: 5.8 ratio — ABNORMAL HIGH (ref 0.0–4.4)
Cholesterol, Total: 253 mg/dL — ABNORMAL HIGH (ref 100–199)
HDL: 44 mg/dL (ref >39)
LDL Chol Calc (NIH): 191 mg/dL — ABNORMAL HIGH (ref 0–99)
Triglycerides: 103 mg/dL (ref 0–149)
VLDL Cholesterol Cal: 18 mg/dL (ref 5–40)

## 2020-01-24 LAB — COMPREHENSIVE METABOLIC PANEL
ALT: 36 IU/L — ABNORMAL HIGH (ref 0–32)
AST: 29 IU/L (ref 0–40)
Albumin/Globulin Ratio: 1.5 (ref 1.2–2.2)
Albumin: 4.7 g/dL (ref 3.8–4.9)
Alkaline Phosphatase: 71 IU/L (ref 48–121)
BUN/Creatinine Ratio: 17 (ref 9–23)
BUN: 15 mg/dL (ref 6–24)
Bilirubin Total: 0.5 mg/dL (ref 0.0–1.2)
CO2: 20 mmol/L (ref 20–29)
Calcium: 9.6 mg/dL (ref 8.7–10.2)
Chloride: 99 mmol/L (ref 96–106)
Creatinine, Ser: 0.87 mg/dL (ref 0.57–1.00)
GFR calc Af Amer: 89 mL/min/{1.73_m2} (ref >59)
GFR calc non Af Amer: 77 mL/min/{1.73_m2} (ref >59)
Globulin, Total: 3.1 g/dL (ref 1.5–4.5)
Glucose: 91 mg/dL (ref 65–99)
Potassium: 4.5 mmol/L (ref 3.5–5.2)
Sodium: 138 mmol/L (ref 134–144)
Total Protein: 7.8 g/dL (ref 6.0–8.5)

## 2020-01-24 LAB — HEMOGLOBIN A1C
Est. average glucose Bld gHb Est-mCnc: 126 mg/dL
Hgb A1c MFr Bld: 6 % — ABNORMAL HIGH (ref 4.8–5.6)

## 2020-01-24 LAB — VITAMIN D 25 HYDROXY (VIT D DEFICIENCY, FRACTURES): Vit D, 25-Hydroxy: 42.3 ng/mL (ref 30.0–100.0)
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
## 2020-01-28 ENCOUNTER — Encounter (INDEPENDENT_AMBULATORY_CARE_PROVIDER_SITE_OTHER): Payer: Self-pay | Admitting: Family Medicine

## 2020-02-04 ENCOUNTER — Ambulatory Visit (INDEPENDENT_AMBULATORY_CARE_PROVIDER_SITE_OTHER): Payer: Commercial Managed Care - PPO | Admitting: Psychology

## 2020-02-04 DIAGNOSIS — F4321 Adjustment disorder with depressed mood: Secondary | ICD-10-CM | POA: Diagnosis not present

## 2020-02-07 ENCOUNTER — Telehealth (INDEPENDENT_AMBULATORY_CARE_PROVIDER_SITE_OTHER): Payer: Commercial Managed Care - PPO | Admitting: Psychology

## 2020-02-07 ENCOUNTER — Other Ambulatory Visit: Payer: Self-pay

## 2020-02-07 DIAGNOSIS — F5089 Other specified eating disorder: Secondary | ICD-10-CM

## 2020-02-07 DIAGNOSIS — F1099 Alcohol use, unspecified with unspecified alcohol-induced disorder: Secondary | ICD-10-CM

## 2020-02-13 ENCOUNTER — Ambulatory Visit (INDEPENDENT_AMBULATORY_CARE_PROVIDER_SITE_OTHER): Payer: Commercial Managed Care - PPO | Admitting: Family Medicine

## 2020-02-13 ENCOUNTER — Other Ambulatory Visit: Payer: Self-pay

## 2020-02-13 ENCOUNTER — Encounter (INDEPENDENT_AMBULATORY_CARE_PROVIDER_SITE_OTHER): Payer: Self-pay | Admitting: Family Medicine

## 2020-02-13 VITALS — BP 143/80 | HR 76 | Temp 98.2°F | Ht 62.0 in | Wt 172.0 lb

## 2020-02-13 DIAGNOSIS — Z6831 Body mass index (BMI) 31.0-31.9, adult: Secondary | ICD-10-CM

## 2020-02-13 DIAGNOSIS — E669 Obesity, unspecified: Secondary | ICD-10-CM

## 2020-02-13 DIAGNOSIS — Z9189 Other specified personal risk factors, not elsewhere classified: Secondary | ICD-10-CM | POA: Diagnosis not present

## 2020-02-13 DIAGNOSIS — R7989 Other specified abnormal findings of blood chemistry: Secondary | ICD-10-CM | POA: Diagnosis not present

## 2020-02-13 DIAGNOSIS — E7849 Other hyperlipidemia: Secondary | ICD-10-CM

## 2020-02-13 DIAGNOSIS — R7303 Prediabetes: Secondary | ICD-10-CM

## 2020-02-13 DIAGNOSIS — E66811 Obesity, class 1: Secondary | ICD-10-CM

## 2020-02-13 DIAGNOSIS — E559 Vitamin D deficiency, unspecified: Secondary | ICD-10-CM | POA: Diagnosis not present

## 2020-02-13 NOTE — Progress Notes (Signed)
Chief Complaint:   OBESITY [name] is here to discuss her progress with her obesity treatment plan along with follow-up of her obesity related diagnoses. [name] is on the Category 1 Plan and states she is following her eating plan approximately 80% of the time. [name] states she has increased her activity.  Today's visit was #: 9 Starting weight: 185 lbs Starting date: 08/21/2019 Today's weight: 172 lbs Today's date: 02/13/2020 Total lbs lost to date: 13 lbs Total lbs lost since last in-office visit: 1 lb Total weight loss percentage to date: 7.03%  Interim History: [name] says she is working on following the plan.  She has increased his water intake and is moving more.  She is tolerating her medications without side effects.  She says she has decreased her alcohol intake.  She stopped buying white wine.  She is now drinking red only, 1-2 glasses.  She will be going to [place] next week with her daughter.  Subjective:   1. Elevated LFTs  Lab Results  Component Value Date   ALT 36 (H) 01/23/2020   AST 29 01/23/2020   ALKPHOS 71 01/23/2020   BILITOT 0.5 01/23/2020   2. Prediabetes Stable, but not optimized.  Lab Results  Component Value Date   HGBA1C 6.0 (H) 01/23/2020   Lab Results  Component Value Date   INSULIN 12.4 08/21/2019   3. Hyperlipidemia  Lab Results  Component Value Date   ALT 36 (H) 01/23/2020   AST 29 01/23/2020   ALKPHOS 71 01/23/2020   BILITOT 0.5 01/23/2020   Lab Results  Component Value Date   CHOL 253 (H) 01/23/2020   HDL 44 01/23/2020   LDLCALC 191 (H) 01/23/2020   TRIG 103 01/23/2020   CHOLHDL 5.8 (H) 01/23/2020   4. Vitamin D deficiency [name]'s Vitamin D level was 42.3 on 01/23/2020. She is currently taking prescription vitamin D 50,000 IU each week.   5. At risk for heart disease [name] is at a higher than average risk for cardiovascular disease due to obesity, metabolic syndrome, and alcohol intake.  Assessment/Plan:   1. Elevated  LFTs Elevated liver transaminases with an ALT predominance combined with obesity and insulin resistance is characteristic, but not diagnostic of non-alcoholic fatty liver disease (NAFLD). NAFLD is the 2nd leading cause of liver transplant in adults. Treatment includes weight loss, elimination of sweet drinks, including juice, avoidance of high fructose corn syrup, and exercise. As always, avoiding alcohol consumption is important.  2. Prediabetes Discussed labs with patient today.  Zylee will continue to work on weight loss, exercise, and decreasing simple carbohydrates to help decrease the risk of diabetes.   3. Other hyperlipidemia Worsening.  Discussed labs with patient today.  Cardiovascular risk and specific lipid/LDL goals reviewed.  We discussed several lifestyle modifications today and Hlee will continue to work on diet, exercise and weight loss efforts. Orders and follow up as documented in patient record.   4. Vitamin D deficiency Discussed labs with patient today.  Low Vitamin D level contributes to fatigue and are associated with obesity, breast, and colon cancer. She agrees to continue to take prescription Vitamin D @50 ,000 IU every week and will follow-up for routine testing of Vitamin D, at least 2-3 times per year to avoid over-replacement.  Vitamin D deficiency is improving.  5. At risk for heart disease [name] was given approximately 15 minutes of coronary artery disease prevention counseling today. She is 49 y.o. female and has risk factors for heart disease including obesity,  metabolic syndrome, and alcohol intake. We discussed intensive lifestyle modifications today with an emphasis on specific weight loss instructions and strategies.   Repetitive spaced learning was employed today to elicit superior memory formation and behavioral change.  6. Class 1 obesity with serious comorbidity and body mass index (BMI) of 31.0 to 31.9 in adult, unspecified obesity type [name] is currently  in the action stage of change. As such, her goal is to continue with weight loss efforts. She has agreed to the Category 1 Plan.   Exercise goals: For substantial health benefits, adults should do at least 150 minutes (2 hours and 30 minutes) a week of moderate-intensity, or 75 minutes (1 hour and 15 minutes) a week of vigorous-intensity aerobic physical activity, or an equivalent combination of moderate- and vigorous-intensity aerobic activity. Aerobic activity should be performed in episodes of at least 10 minutes, and preferably, it should be spread throughout the week.  Behavioral modification strategies: increasing lean protein intake and decreasing alcohol intake.  [name] has agreed to follow-up with our clinic in 3-4 weeks. She was informed of the importance of frequent follow-up visits to maximize her success with intensive lifestyle modifications for her multiple health conditions.   Objective:   Blood pressure (!) 143/80, pulse 76, temperature 98.2 F (36.8 C), temperature source Oral, height 5\' 2"  (1.575 m), weight 172 lb (78 kg), SpO2 98 %. Body mass index is 31.46 kg/m.  General: Cooperative, alert, well developed, in no acute distress. HEENT: Conjunctivae and lids unremarkable. Cardiovascular: Regular rhythm.  Lungs: Normal work of breathing. Neurologic: No focal deficits.   Lab Results  Component Value Date   CREATININE 0.87 01/23/2020   BUN 15 01/23/2020   NA 138 01/23/2020   K 4.5 01/23/2020   CL 99 01/23/2020   CO2 20 01/23/2020   Lab Results  Component Value Date   ALT 36 (H) 01/23/2020   AST 29 01/23/2020   ALKPHOS 71 01/23/2020   BILITOT 0.5 01/23/2020   Lab Results  Component Value Date   HGBA1C 6.0 (H) 01/23/2020   HGBA1C 6.0 (H) 08/21/2019   Lab Results  Component Value Date   INSULIN 12.4 08/21/2019   Lab Results  Component Value Date   TSH 2.280 08/21/2019   Lab Results  Component Value Date   CHOL 253 (H) 01/23/2020   HDL 44 01/23/2020    LDLCALC 191 (H) 01/23/2020   TRIG 103 01/23/2020   CHOLHDL 5.8 (H) 01/23/2020   Lab Results  Component Value Date   WBC 7.7 01/23/2020   HGB 16.8 (H) 01/23/2020   HCT 49.2 (H) 01/23/2020   MCV 85 01/23/2020   PLT 221 01/23/2020   Lab Results  Component Value Date   IRON 91 01/23/2020   TIBC 368 01/23/2020   FERRITIN 379 (H) 01/23/2020   Attestation Statements:   Reviewed by clinician on day of visit: allergies, medications, problem list, medical history, surgical history, family history, social history, and previous encounter notes.  I, 01/25/2020, CMA, am acting as transcriptionist for Insurance claims handler, DO  I have reviewed the above documentation for accuracy and completeness, and I agree with the above. Helane Rima, DO

## 2020-02-25 ENCOUNTER — Encounter (INDEPENDENT_AMBULATORY_CARE_PROVIDER_SITE_OTHER): Payer: Self-pay

## 2020-02-25 ENCOUNTER — Other Ambulatory Visit (INDEPENDENT_AMBULATORY_CARE_PROVIDER_SITE_OTHER): Payer: Self-pay | Admitting: Family Medicine

## 2020-02-25 DIAGNOSIS — F3289 Other specified depressive episodes: Secondary | ICD-10-CM

## 2020-02-26 ENCOUNTER — Ambulatory Visit (INDEPENDENT_AMBULATORY_CARE_PROVIDER_SITE_OTHER): Payer: Commercial Managed Care - PPO | Admitting: Psychology

## 2020-02-26 DIAGNOSIS — F4321 Adjustment disorder with depressed mood: Secondary | ICD-10-CM

## 2020-02-26 NOTE — Progress Notes (Deleted)
Cardiology Office Note:    Date:  02/26/2020   ID:  ARES TEGTMEYER, DOB 02-08-1971, MRN [id number]  PCP:  Merri Brunette, MD  Cardiologist:  [name], MD *** Electrophysiologist:  None   Referring MD: Merri Brunette, MD   Chief Complaint:  No chief complaint on file.    Patient Profile:    [name] is a 49 y.o. female with:   Hypertension   Hyperlipidemia   MVP  Prior CV studies: Echocardiogram 10/08/04 EF 55-60, mild LVH, mild post leaflet prolapse, trace TR  History of Present Illness:    [name] was last seen by Dr. Elease Hashimoto in 06/2019.  She returns for follow up.  The [employer] SmartLink is not supported in this context. ***   Past Medical History:  Diagnosis Date  . Anemia   . Asthma   . Back pain   . Constipated   . GERD (gastroesophageal reflux disease)   . High cholesterol   . Hypertension   . IBS (irritable bowel syndrome)   . Joint pain   . Lactose intolerance   . Mitral valve prolapse   . Multiple food allergies   . Palpitation   . Panic attack   . Right knee pain   . Seasonal allergies     Current Medications: No outpatient medications have been marked as taking for the 02/27/20 encounter (Appointment) with Tereso Newcomer T, PA-C.     Allergies:   Motrin [ibuprofen], Other, Penicillins, and Sulfa antibiotics   Social History   Tobacco Use  . Smoking status: Never Smoker  . Smokeless tobacco: Never Used  Vaping Use  . Vaping Use: Never used  Substance Use Topics  . Alcohol use: Never  . Drug use: No     Family Hx: The patient's family history includes Alcoholism in her father; Cancer in her mother; Diabetes in her father, maternal grandmother, and mother; Heart attack in her maternal uncle; Heart disease in her mother; Hyperlipidemia in her father, maternal grandmother, and mother; Hypertension in her father, maternal grandmother, and mother; Obesity in her mother; Stroke in her father. There is no history of Sudden  death.  ROS   EKGs/Labs/Other Test Reviewed:    EKG:  EKG is *** ordered today.  The ekg ordered today demonstrates ***  Recent Labs: 08/21/2019: TSH 2.280 01/23/2020: ALT 36; BUN 15; Creatinine, Ser 0.87; Hemoglobin 16.8; Platelets 221; Potassium 4.5; Sodium 138   Recent Lipid Panel Lab Results  Component Value Date/Time   CHOL 253 (H) 01/23/2020 03:51 PM   TRIG 103 01/23/2020 03:51 PM   HDL 44 01/23/2020 03:51 PM   CHOLHDL 5.8 (H) 01/23/2020 03:51 PM   CHOLHDL 4.3 06/20/2015 02:14 PM   LDLCALC 191 (H) 01/23/2020 03:51 PM    Physical Exam:    VS:  There were no vitals taken for this visit.    Wt Readings from Last 3 Encounters:  02/13/20 172 lb (78 kg)  01/23/20 173 lb (78.5 kg)  12/31/19 176 lb (79.8 kg)     Physical Exam ***  ASSESSMENT & PLAN:    ***  Dispo:  No follow-ups on file.   Medication Adjustments/Labs and Tests Ordered: Current medicines are reviewed at length with the patient today.  Concerns regarding medicines are outlined above.  Tests Ordered: No orders of the defined types were placed in this encounter.  Medication Changes: No orders of the defined types were placed in this encounter.   Signed, Tereso Newcomer, PA-C  02/26/2020 10:55  PM    [facility] [address], [place], [place]  [address] Phone: [phone]; Fax: [phone]

## 2020-02-27 ENCOUNTER — Other Ambulatory Visit (INDEPENDENT_AMBULATORY_CARE_PROVIDER_SITE_OTHER): Payer: Self-pay | Admitting: Family Medicine

## 2020-02-27 ENCOUNTER — Ambulatory Visit: Payer: Commercial Managed Care - PPO | Admitting: Physician Assistant

## 2020-02-27 DIAGNOSIS — E559 Vitamin D deficiency, unspecified: Secondary | ICD-10-CM

## 2020-02-27 DIAGNOSIS — F419 Anxiety disorder, unspecified: Secondary | ICD-10-CM

## 2020-02-27 DIAGNOSIS — F3289 Other specified depressive episodes: Secondary | ICD-10-CM

## 2020-02-27 DIAGNOSIS — I10 Essential (primary) hypertension: Secondary | ICD-10-CM

## 2020-02-27 MED ORDER — BUPROPION HCL ER (XL) 150 MG PO TB24: 150.0000 mg | ORAL_TABLET | Freq: Every day | ORAL | 0 refills | Status: DC

## 2020-02-27 MED ORDER — CLONAZEPAM 0.5 MG PO TABS: 0.5000 mg | ORAL_TABLET | Freq: Every day | ORAL | 0 refills | Status: DC

## 2020-02-27 MED ORDER — PHENTERMINE HCL 37.5 MG PO TABS: 37.5000 mg | ORAL_TABLET | Freq: Every day | ORAL | 0 refills | Status: DC

## 2020-02-27 MED ORDER — VITAMIN D (ERGOCALCIFEROL) 1.25 MG (50000 UNIT) PO CAPS: 50000.0000 [IU] | ORAL_CAPSULE | ORAL | 0 refills | Status: DC

## 2020-03-12 ENCOUNTER — Ambulatory Visit (INDEPENDENT_AMBULATORY_CARE_PROVIDER_SITE_OTHER): Payer: Commercial Managed Care - PPO | Admitting: Family Medicine

## 2020-03-19 ENCOUNTER — Ambulatory Visit: Payer: Commercial Managed Care - PPO | Admitting: Psychology

## 2020-03-21 ENCOUNTER — Ambulatory Visit: Payer: Commercial Managed Care - PPO | Admitting: Psychology

## 2020-03-25 ENCOUNTER — Encounter (INDEPENDENT_AMBULATORY_CARE_PROVIDER_SITE_OTHER): Payer: Self-pay | Admitting: Family Medicine

## 2020-03-25 ENCOUNTER — Other Ambulatory Visit: Payer: Self-pay

## 2020-03-25 ENCOUNTER — Ambulatory Visit (INDEPENDENT_AMBULATORY_CARE_PROVIDER_SITE_OTHER): Payer: Commercial Managed Care - PPO | Admitting: Family Medicine

## 2020-03-25 VITALS — BP 115/77 | HR 79 | Temp 98.1°F | Ht 62.0 in | Wt 169.0 lb

## 2020-03-25 DIAGNOSIS — E559 Vitamin D deficiency, unspecified: Secondary | ICD-10-CM | POA: Diagnosis not present

## 2020-03-25 DIAGNOSIS — Z789 Other specified health status: Secondary | ICD-10-CM

## 2020-03-25 DIAGNOSIS — Z7289 Other problems related to lifestyle: Secondary | ICD-10-CM | POA: Diagnosis not present

## 2020-03-25 DIAGNOSIS — Z9189 Other specified personal risk factors, not elsewhere classified: Secondary | ICD-10-CM | POA: Diagnosis not present

## 2020-03-25 DIAGNOSIS — R7989 Other specified abnormal findings of blood chemistry: Secondary | ICD-10-CM

## 2020-03-25 DIAGNOSIS — Z6831 Body mass index (BMI) 31.0-31.9, adult: Secondary | ICD-10-CM

## 2020-03-25 DIAGNOSIS — R7303 Prediabetes: Secondary | ICD-10-CM

## 2020-03-25 DIAGNOSIS — E669 Obesity, unspecified: Secondary | ICD-10-CM

## 2020-03-27 NOTE — Progress Notes (Signed)
Chief Complaint:   OBESITY [name] is here to discuss her progress with her obesity treatment plan along with follow-up of her obesity related diagnoses. [name] is on the Category 1 Plan and states she is following her eating plan approximately 60% of the time. [name] states she has increased her activity.  Today's visit was #: 10 Starting weight: 185 lbs Starting date: 08/21/2019 Today's weight: 169 lbs Today's date: 03/25/2020 Total lbs lost to date: 16 lbs Total lbs lost since last in-office visit: 3 lbs Total weight loss percentage to date: -8.65%  Interim History: [name] says she is working on setting up her home.  Her ex-husband is sleeping on the couch.  It is past his move out date.  She is still coping with wine.  Assessment/Plan:   1. Prediabetes Not at goal. Goal is HgbA1c < 5.7 and insulin level closer to 5. She will continue to focus on protein-rich, low simple carbohydrate foods. We reviewed the importance of hydration, regular exercise for stress reduction, and restorative sleep.   Lab Results  Component Value Date   HGBA1C 6.0 (H) 01/23/2020   Lab Results  Component Value Date   INSULIN 12.4 08/21/2019   2. Vitamin D deficiency Current vitamin D is 42.3, tested on 01/23/2020. Not at goal. Optimal goal > 50 ng/dL. There is also evidence to support a goal of >70 ng/dL in patients with cancer and heart disease. Plan: Continue Vitamin D @50 ,000 IU every week with follow-up for routine testing of Vitamin D at least 2-3 times per year to avoid over-replacement.  3. Elevated LFTs Elevated liver transaminases with an ALT predominance combined with obesity and insulin resistance is characteristic, but not diagnostic of non-alcoholic fatty liver disease (NAFLD). [name] has the added risk of routine alcohol intake. We discussed the importance of lifestyle changes and alcohol cessation.  Lab Results  Component Value Date   ALT 36 (H) 01/23/2020   AST 29 01/23/2020   ALKPHOS  71 01/23/2020   BILITOT 0.5 01/23/2020   4. Alcohol use Strongly encouraged alcohol cessation for overall health and well-being.  5. At risk for heart disease [name] was given approximately 15 minutes of coronary artery disease prevention counseling today. She is 49 y.o. female and has risk factors for heart disease including obesity and IR. We discussed intensive lifestyle modifications today with an emphasis on specific weight loss instructions and strategies.   During insulin resistance, several metabolic alterations induce the development of cardiovascular disease. For instance, insulin resistance can induce an imbalance in glucose metabolism that generates chronic hyperglycemia, which in turn triggers oxidative stress and causes an inflammatory response that leads to cell damage. Insulin resistance can also alter systemic lipid metabolism which then leads to the development of dyslipidemia and the well-known lipid triad: (1) high levels of plasma triglycerides, (2) low levels of high-density lipoprotein, and (3) the appearance of small dense low-density lipoproteins. This triad, along with endothelial dysfunction, which can also be induced by aberrant insulin signaling, contribute to atherosclerotic plaque formation.   6. Class 1 obesity with serious comorbidity and body mass index (BMI) of 31.0 to 31.9 in adult, unspecified obesity type [name] is currently in the action stage of change. As such, her goal is to continue with weight loss efforts. She has agreed to the Category 1 Plan.   Exercise goals: For substantial health benefits, adults should do at least 150 minutes (2 hours and 30 minutes) a week of moderate-intensity, or 75 minutes (1 hour  and 15 minutes) a week of vigorous-intensity aerobic physical activity, or an equivalent combination of moderate- and vigorous-intensity aerobic activity. Aerobic activity should be performed in episodes of at least 10 minutes, and preferably, it should be  spread throughout the week.  Behavioral modification strategies: increasing lean protein intake, increasing water intake, decreasing alcohol intake and emotional eating strategies.  [name] has agreed to follow-up with our clinic in 3 weeks. She was informed of the importance of frequent follow-up visits to maximize her success with intensive lifestyle modifications for her multiple health conditions.   Objective:   Blood pressure 115/77, pulse 79, temperature 98.1 F (36.7 C), temperature source Oral, height 5\' 2"  (1.575 m), weight 169 lb (76.7 kg), SpO2 99 %. Body mass index is 30.91 kg/m.  General: Cooperative, alert, well developed, in no acute distress. HEENT: Conjunctivae and lids unremarkable. Cardiovascular: Regular rhythm.  Lungs: Normal work of breathing. Neurologic: No focal deficits.   Lab Results  Component Value Date   CREATININE 0.87 01/23/2020   BUN 15 01/23/2020   NA 138 01/23/2020   K 4.5 01/23/2020   CL 99 01/23/2020   CO2 20 01/23/2020   Lab Results  Component Value Date   ALT 36 (H) 01/23/2020   AST 29 01/23/2020   ALKPHOS 71 01/23/2020   BILITOT 0.5 01/23/2020   Lab Results  Component Value Date   HGBA1C 6.0 (H) 01/23/2020   HGBA1C 6.0 (H) 08/21/2019   Lab Results  Component Value Date   INSULIN 12.4 08/21/2019   Lab Results  Component Value Date   TSH 2.280 08/21/2019   Lab Results  Component Value Date   CHOL 253 (H) 01/23/2020   HDL 44 01/23/2020   LDLCALC 191 (H) 01/23/2020   TRIG 103 01/23/2020   CHOLHDL 5.8 (H) 01/23/2020   Lab Results  Component Value Date   WBC 7.7 01/23/2020   HGB 16.8 (H) 01/23/2020   HCT 49.2 (H) 01/23/2020   MCV 85 01/23/2020   PLT 221 01/23/2020   Lab Results  Component Value Date   IRON 91 01/23/2020   TIBC 368 01/23/2020   FERRITIN 379 (H) 01/23/2020   Attestation Statements:   Reviewed by clinician on day of visit: allergies, medications, problem list, medical history, surgical history, family  history, social history, and previous encounter notes.  I, 01/25/2020, CMA, am acting as transcriptionist for Insurance claims handler, DO  I have reviewed the above documentation for accuracy and completeness, and I agree with the above. Helane Rima, DO

## 2020-04-09 ENCOUNTER — Ambulatory Visit: Payer: Commercial Managed Care - PPO | Admitting: Psychology

## 2020-04-15 ENCOUNTER — Encounter (INDEPENDENT_AMBULATORY_CARE_PROVIDER_SITE_OTHER): Payer: Self-pay | Admitting: Family Medicine

## 2020-04-15 ENCOUNTER — Other Ambulatory Visit: Payer: Self-pay

## 2020-04-15 ENCOUNTER — Ambulatory Visit (INDEPENDENT_AMBULATORY_CARE_PROVIDER_SITE_OTHER): Payer: Commercial Managed Care - PPO | Admitting: Family Medicine

## 2020-04-15 VITALS — BP 112/78 | HR 84 | Temp 98.4°F | Ht 62.0 in | Wt 169.0 lb

## 2020-04-15 DIAGNOSIS — Z6831 Body mass index (BMI) 31.0-31.9, adult: Secondary | ICD-10-CM

## 2020-04-15 DIAGNOSIS — F3289 Other specified depressive episodes: Secondary | ICD-10-CM | POA: Diagnosis not present

## 2020-04-15 DIAGNOSIS — R632 Polyphagia: Secondary | ICD-10-CM

## 2020-04-15 DIAGNOSIS — F418 Other specified anxiety disorders: Secondary | ICD-10-CM

## 2020-04-15 DIAGNOSIS — E669 Obesity, unspecified: Secondary | ICD-10-CM | POA: Diagnosis not present

## 2020-04-15 MED ORDER — BUPROPION HCL ER (XL) 150 MG PO TB24: 150.0000 mg | ORAL_TABLET | Freq: Every day | ORAL | 0 refills | Status: DC

## 2020-04-15 MED ORDER — PHENTERMINE HCL 37.5 MG PO TABS: 37.5000 mg | ORAL_TABLET | Freq: Every day | ORAL | 0 refills | Status: DC

## 2020-04-15 MED ORDER — CLONAZEPAM 0.5 MG PO TABS: 0.5000 mg | ORAL_TABLET | Freq: Every day | ORAL | 0 refills | Status: DC

## 2020-04-16 NOTE — Progress Notes (Signed)
Chief Complaint:   OBESITY [name] is here to discuss her progress with her obesity treatment plan along with follow-up of her obesity related diagnoses. [name] is on the Category 1 Plan and states she is following her eating plan approximately 75% of the time. [name] states she has increased her walking.  Today's visit was #: 11 Starting weight: 185 lbs Starting date: 08/21/2019 Today's weight: 169 lbs Today's date: 04/15/2020 Total lbs lost to date: 16 lbs Total lbs lost since last in-office visit: 0 Total weight loss percentage to date: -8.65%  Interim History:  ETOH:  Doing better.  Half bottler/day (3-4 bottles per week). Food:  Making better choices. Medications:  Skipping doses. Exercise:  2500 steps per day.  Slim cycle for 2 days per week.  Assessment/Plan:   1. Polyphagia Hyperphagia, also called polyphagia, refers to excessive feelings of hunger, which are not relieved by eating. This is more likely to be an issues for people that have diabetes, prediabetes, or insulin resistance. She will continue to focus on protein-rich, low simple carbohydrate foods. We reviewed the importance of hydration, regular exercise for stress reduction, and restorative sleep.  - Refill phentermine (ADIPEX-P) 37.5 MG tablet; Take 1 tablet (37.5 mg total) by mouth daily before breakfast.  Dispense: 30 tablet; Refill: 0  2. Situational anxiety Behavior modification techniques were discussed today to help Ande deal with her anxiety.  She has been making great strides re: having her ex-husband move out.  - Refill clonazePAM (KLONOPIN) 0.5 MG tablet; Take 1-2 tablets (0.5-1 mg total) by mouth daily in the afternoon.  Dispense: 20 tablet; Refill: 0  I have consulted the [place] Controlled Substances Registry for this patient, and feel the risk/benefit ratio today is favorable for proceeding with this prescription for a controlled substance. The patient understands monitoring parameters and red flags.    3. Other depression, with emotional eating She is taking Wellbutrin 150 mg daily.  Behavior modification techniques were discussed today to help [name] deal with her emotional/non-hunger eating behaviors.    - Refill buPROPion (WELLBUTRIN XL) 150 MG 24 hr tablet; Take 1 tablet (150 mg total) by mouth daily.  Dispense: 30 tablet; Refill: 0  4. Class 1 obesity with serious comorbidity and body mass index (BMI) of 31.0 to 31.9 in adult, unspecified obesity type  Jenesys is currently in the action stage of change. As such, her goal is to continue with weight loss efforts. She has agreed to keeping a food journal and adhering to recommended goals of 1000 calories and 85 grams of protein.   Exercise goals: For substantial health benefits, adults should do at least 150 minutes (2 hours and 30 minutes) a week of moderate-intensity, or 75 minutes (1 hour and 15 minutes) a week of vigorous-intensity aerobic physical activity, or an equivalent combination of moderate- and vigorous-intensity aerobic activity. Aerobic activity should be performed in episodes of at least 10 minutes, and preferably, it should be spread throughout the week.  Behavioral modification strategies: increasing lean protein intake, decreasing simple carbohydrates, increasing vegetables, increasing water intake, decreasing liquid calories, decreasing alcohol intake, decreasing sodium intake and increasing high fiber foods.  [name] has agreed to follow-up with our clinic in 3 weeks. She was informed of the importance of frequent follow-up visits to maximize her success with intensive lifestyle modifications for her multiple health conditions.   Objective:   Blood pressure 112/78, pulse 84, temperature 98.4 F (36.9 C), temperature source Oral, height 5\' 2"  (1.575 m), weight  169 lb (76.7 kg), SpO2 98 %. Body mass index is 30.91 kg/m.  General: Cooperative, alert, well developed, in no acute distress. HEENT: Conjunctivae and lids  unremarkable. Cardiovascular: Regular rhythm.  Lungs: Normal work of breathing. Neurologic: No focal deficits.   Lab Results  Component Value Date   CREATININE 0.87 01/23/2020   BUN 15 01/23/2020   NA 138 01/23/2020   K 4.5 01/23/2020   CL 99 01/23/2020   CO2 20 01/23/2020   Lab Results  Component Value Date   ALT 36 (H) 01/23/2020   AST 29 01/23/2020   ALKPHOS 71 01/23/2020   BILITOT 0.5 01/23/2020   Lab Results  Component Value Date   HGBA1C 6.0 (H) 01/23/2020   HGBA1C 6.0 (H) 08/21/2019   Lab Results  Component Value Date   INSULIN 12.4 08/21/2019   Lab Results  Component Value Date   TSH 2.280 08/21/2019   Lab Results  Component Value Date   CHOL 253 (H) 01/23/2020   HDL 44 01/23/2020   LDLCALC 191 (H) 01/23/2020   TRIG 103 01/23/2020   CHOLHDL 5.8 (H) 01/23/2020   Lab Results  Component Value Date   WBC 7.7 01/23/2020   HGB 16.8 (H) 01/23/2020   HCT 49.2 (H) 01/23/2020   MCV 85 01/23/2020   PLT 221 01/23/2020   Lab Results  Component Value Date   IRON 91 01/23/2020   TIBC 368 01/23/2020   FERRITIN 379 (H) 01/23/2020   Attestation Statements:   Reviewed by clinician on day of visit: allergies, medications, problem list, medical history, surgical history, family history, social history, and previous encounter notes.  I, Insurance claims handler, CMA, am acting as transcriptionist for Helane Rima, DO  I have reviewed the above documentation for accuracy and completeness, and I agree with the above. Helane Rima, DO

## 2020-04-30 ENCOUNTER — Ambulatory Visit (INDEPENDENT_AMBULATORY_CARE_PROVIDER_SITE_OTHER): Payer: Commercial Managed Care - PPO | Admitting: Psychology

## 2020-04-30 DIAGNOSIS — F4321 Adjustment disorder with depressed mood: Secondary | ICD-10-CM | POA: Diagnosis not present

## 2020-05-06 ENCOUNTER — Other Ambulatory Visit: Payer: Self-pay

## 2020-05-06 ENCOUNTER — Encounter (INDEPENDENT_AMBULATORY_CARE_PROVIDER_SITE_OTHER): Payer: Self-pay | Admitting: Family Medicine

## 2020-05-06 ENCOUNTER — Ambulatory Visit (INDEPENDENT_AMBULATORY_CARE_PROVIDER_SITE_OTHER): Payer: Commercial Managed Care - PPO | Admitting: Family Medicine

## 2020-05-06 VITALS — BP 115/78 | HR 90 | Temp 98.3°F | Ht 62.0 in | Wt 170.0 lb

## 2020-05-06 DIAGNOSIS — E669 Obesity, unspecified: Secondary | ICD-10-CM

## 2020-05-06 DIAGNOSIS — R632 Polyphagia: Secondary | ICD-10-CM

## 2020-05-06 DIAGNOSIS — E559 Vitamin D deficiency, unspecified: Secondary | ICD-10-CM | POA: Diagnosis not present

## 2020-05-06 DIAGNOSIS — Z6831 Body mass index (BMI) 31.0-31.9, adult: Secondary | ICD-10-CM

## 2020-05-06 DIAGNOSIS — Z9189 Other specified personal risk factors, not elsewhere classified: Secondary | ICD-10-CM

## 2020-05-06 DIAGNOSIS — F3289 Other specified depressive episodes: Secondary | ICD-10-CM | POA: Diagnosis not present

## 2020-05-08 NOTE — Progress Notes (Signed)
Chief Complaint:   OBESITY [name] is here to discuss her progress with her obesity treatment plan along with follow-up of her obesity related diagnoses.   Today's visit was #: 12 Starting weight: 185 lbs Starting date: 08/21/2019 Today's weight: 170 lbs Today's date: 05/06/2020 Total lbs lost to date: 15 lbs Body mass index is 31.09 kg/m.  Total weight loss percentage to date: -8.11%  Interim History: [name]'s stress level has increased.  Her ex-husband is finally out of her house. He is still coming over to help with some their daughter, but she is adjusting to being a true single parent.  She has increased her water intake.    Nutrition Plan: Keeping a food journal and adhering to recommended goals of 1000 calories and 85 grams of protein. Anti-obesity medications: phentermine 37.5 mg daily. Reported side effects: None. Hunger is well controlled controlled. Cravings are well controlled controlled.  Activity:  She is getting 3,000-6,000 steps per day. Sleep is restful.  Stress: Elevated.  Assessment/Plan:   1. Polyphagia Hyperphagia, also called polyphagia, refers to excessive feelings of hunger. This is more likely to be an issues for people that have diabetes, prediabetes, or insulin resistance. She will continue to focus on protein-rich, low simple carbohydrate foods. We reviewed the importance of hydration, regular exercise for stress reduction, and restorative sleep.  2. Vitamin D deficiency Current vitamin D is 42.3, tested on 01/23/2020. Not at goal. Optimal goal > 50 ng/dL.   Plan:  [x]   Continue Vitamin D @50 ,000 IU every week. []   Continue home supplement daily. [x]   Follow-up for routine testing of Vitamin D at least 2-3 times per year to avoid over-replacement. []   Monitored by PCP.  3. Other depression with emotional eating Kanesha is taking Wellbutrin 150 mg daily.  She has been under increased stress recently, but is otherwise doing well.  Denies  SI/HI.  Plan:  Continue medication at current dose.  4. At risk for heart disease [name] was given approximately 15 minutes of coronary artery disease prevention counseling today. She is 49 y.o. female and has risk factors for heart disease including obesity and alcohol use. We again discussed intensive lifestyle modifications today with an emphasis on specific weight loss instructions and strategies. Repetitive spaced learning was employed today to elicit superior memory formation and behavioral change.  5. Class 1 obesity with serious comorbidity and body mass index (BMI) of 31.0 to 31.9 in adult, unspecified obesity type  [name] is currently in the action stage of change. As such, her goal is to continue with weight loss efforts.   Nutrition goals: She has agreed to keeping a food journal and adhering to recommended goals of 1000 calories and 85 grams of protein.   Exercise goals: For substantial health benefits, adults should do at least 150 minutes (2 hours and 30 minutes) a week of moderate-intensity, or 75 minutes (1 hour and 15 minutes) a week of vigorous-intensity aerobic physical activity, or an equivalent combination of moderate- and vigorous-intensity aerobic activity. Aerobic activity should be performed in episodes of at least 10 minutes, and preferably, it should be spread throughout the week.  Behavioral modification strategies: increasing lean protein intake, decreasing simple carbohydrates, increasing vegetables, increasing water intake, decreasing liquid calories and decreasing alcohol intake.  [name] has agreed to follow-up with our clinic in 3 weeks. She was informed of the importance of frequent follow-up visits to maximize her success with intensive lifestyle modifications for her multiple health conditions.   Objective:  Blood pressure 115/78, pulse 90, temperature 98.3 F (36.8 C), height 5\' 2"  (1.575 m), weight 170 lb (77.1 kg), SpO2 96 %. Body mass index is 31.09  kg/m.  General: Cooperative, alert, well developed, in no acute distress. HEENT: Conjunctivae and lids unremarkable. Cardiovascular: Regular rhythm.  Lungs: Normal work of breathing. Neurologic: No focal deficits.   Lab Results  Component Value Date   CREATININE 0.87 01/23/2020   BUN 15 01/23/2020   NA 138 01/23/2020   K 4.5 01/23/2020   CL 99 01/23/2020   CO2 20 01/23/2020   Lab Results  Component Value Date   ALT 36 (H) 01/23/2020   AST 29 01/23/2020   ALKPHOS 71 01/23/2020   BILITOT 0.5 01/23/2020   Lab Results  Component Value Date   HGBA1C 6.0 (H) 01/23/2020   HGBA1C 6.0 (H) 08/21/2019   Lab Results  Component Value Date   INSULIN 12.4 08/21/2019   Lab Results  Component Value Date   TSH 2.280 08/21/2019   Lab Results  Component Value Date   CHOL 253 (H) 01/23/2020   HDL 44 01/23/2020   LDLCALC 191 (H) 01/23/2020   TRIG 103 01/23/2020   CHOLHDL 5.8 (H) 01/23/2020   Lab Results  Component Value Date   WBC 7.7 01/23/2020   HGB 16.8 (H) 01/23/2020   HCT 49.2 (H) 01/23/2020   MCV 85 01/23/2020   PLT 221 01/23/2020   Lab Results  Component Value Date   IRON 91 01/23/2020   TIBC 368 01/23/2020   FERRITIN 379 (H) 01/23/2020   Attestation Statements:   Reviewed by clinician on day of visit: allergies, medications, problem list, medical history, surgical history, family history, social history, and previous encounter notes.  I, 01/25/2020, CMA, am acting as transcriptionist for Insurance claims handler, DO  I have reviewed the above documentation for accuracy and completeness, and I agree with the above. Helane Rima, DO

## 2020-05-21 ENCOUNTER — Ambulatory Visit (INDEPENDENT_AMBULATORY_CARE_PROVIDER_SITE_OTHER): Payer: Commercial Managed Care - PPO | Admitting: Psychology

## 2020-05-21 DIAGNOSIS — F4321 Adjustment disorder with depressed mood: Secondary | ICD-10-CM

## 2020-05-23 ENCOUNTER — Other Ambulatory Visit (INDEPENDENT_AMBULATORY_CARE_PROVIDER_SITE_OTHER): Payer: Self-pay | Admitting: Family Medicine

## 2020-05-23 ENCOUNTER — Encounter (INDEPENDENT_AMBULATORY_CARE_PROVIDER_SITE_OTHER): Payer: Self-pay | Admitting: Family Medicine

## 2020-05-23 DIAGNOSIS — F3289 Other specified depressive episodes: Secondary | ICD-10-CM

## 2020-05-23 DIAGNOSIS — E559 Vitamin D deficiency, unspecified: Secondary | ICD-10-CM

## 2020-05-23 DIAGNOSIS — R632 Polyphagia: Secondary | ICD-10-CM

## 2020-05-24 NOTE — Telephone Encounter (Signed)
Are these okay to refill? Habiba Treloar, CMA

## 2020-05-24 NOTE — Telephone Encounter (Signed)
Please review

## 2020-06-02 ENCOUNTER — Ambulatory Visit (INDEPENDENT_AMBULATORY_CARE_PROVIDER_SITE_OTHER): Payer: Commercial Managed Care - PPO | Admitting: Family Medicine

## 2020-06-12 ENCOUNTER — Ambulatory Visit (INDEPENDENT_AMBULATORY_CARE_PROVIDER_SITE_OTHER): Payer: Commercial Managed Care - PPO | Admitting: Psychology

## 2020-06-12 DIAGNOSIS — F4321 Adjustment disorder with depressed mood: Secondary | ICD-10-CM

## 2020-06-18 ENCOUNTER — Ambulatory Visit (INDEPENDENT_AMBULATORY_CARE_PROVIDER_SITE_OTHER): Payer: Commercial Managed Care - PPO | Admitting: Family Medicine

## 2020-06-18 ENCOUNTER — Encounter (INDEPENDENT_AMBULATORY_CARE_PROVIDER_SITE_OTHER): Payer: Self-pay | Admitting: Family Medicine

## 2020-06-18 ENCOUNTER — Other Ambulatory Visit: Payer: Self-pay

## 2020-06-18 VITALS — BP 125/86 | HR 88 | Temp 98.0°F | Ht 62.0 in | Wt 175.0 lb

## 2020-06-18 DIAGNOSIS — E669 Obesity, unspecified: Secondary | ICD-10-CM

## 2020-06-18 DIAGNOSIS — Z9189 Other specified personal risk factors, not elsewhere classified: Secondary | ICD-10-CM | POA: Diagnosis not present

## 2020-06-18 DIAGNOSIS — R632 Polyphagia: Secondary | ICD-10-CM | POA: Diagnosis not present

## 2020-06-18 DIAGNOSIS — E559 Vitamin D deficiency, unspecified: Secondary | ICD-10-CM

## 2020-06-18 DIAGNOSIS — F3289 Other specified depressive episodes: Secondary | ICD-10-CM | POA: Diagnosis not present

## 2020-06-18 DIAGNOSIS — Z6832 Body mass index (BMI) 32.0-32.9, adult: Secondary | ICD-10-CM

## 2020-06-18 MED ORDER — VITAMIN D (ERGOCALCIFEROL) 1.25 MG (50000 UNIT) PO CAPS: 50000.0000 [IU] | ORAL_CAPSULE | ORAL | 0 refills | Status: DC

## 2020-06-18 MED ORDER — PHENTERMINE HCL 37.5 MG PO TABS: 37.5000 mg | ORAL_TABLET | Freq: Every day | ORAL | 0 refills | Status: DC

## 2020-06-18 MED ORDER — BUPROPION HCL ER (XL) 150 MG PO TB24: 150.0000 mg | ORAL_TABLET | Freq: Every day | ORAL | 0 refills | Status: DC

## 2020-06-19 NOTE — Progress Notes (Signed)
Chief Complaint:   OBESITY [name] is here to discuss her progress with her obesity treatment plan along with follow-up of her obesity related diagnoses.   Today's visit was #: 13 Starting weight: 185 lbs Starting date: 08/21/2019 Today's weight: 175 lbs Today's date: 06/18/2020 Total lbs lost to date: 10 lbs Body mass index is 32.01 kg/m.  Total weight loss percentage to date: -5.41%  Interim History: [name] says she is doing a cleanse before going to [place].  We discussed the safety issues associated them. She is leaving on Saturday.  Today's bioimpedance results indicate that [name] has gained 2.5 pounds of water weight since her last visit.  She feels bloated. Nutrition Plan: keeping a food journal and adhering to recommended goals of 1000 calories and 85 grams of protein for 70% of the time.  Anti-obesity medications: phentermine.  Activity: Walking 8,500 steps 3-5 days per week.  Assessment/Plan:   1. Vitamin D deficiency Not at goal. Current vitamin D is 42.3, tested on 01/23/2020. Optimal goal > 50 ng/dL.   Plan:  [x]   Continue Vitamin D @50 ,000 IU every week. []   Continue home supplement daily. [x]   Follow-up for routine testing of Vitamin D at least 2-3 times per year to avoid over-replacement.  - Refill Vitamin D, Ergocalciferol, (DRISDOL) 1.25 MG (50000 UNIT) CAPS capsule; Take 1 capsule (50,000 Units total) by mouth every 7 (seven) days.  Dispense: 12 capsule; Refill: 0  2. Polyphagia Controlled. She will continue to focus on protein-rich, low simple carbohydrate foods. We reviewed the importance of hydration, regular exercise for stress reduction, and restorative sleep.  - Refill phentermine (ADIPEX-P) 37.5 MG tablet; Take 1 tablet (37.5 mg total) by mouth daily before breakfast.  Dispense: 30 tablet; Refill: 0  This patient 1) has no evidence of serious cardiovascular disease; 2) does not have serious psychiatric disease or a history of substance abuse; 3) has been  informed about weight loss medications that are FDA-approved for long term use and told that these have been documented to be safe and effective; 4) does not demonstrate a clinically significant increase in pulse or BP when taking phentermine; and 5) demonstrates significant weight loss while using the medication. Patient understands that all anti-obesity medications are contraindicated in pregnancy. Pt denies a history of glaucoma.   Patient understands that long-term use of phentermine is considered off-label use of this medication, however, that the Endocrine Society and recent research supports that long-term use of phentermine does not appear to have detrimental health effects when used in the appropriate patient. In addition, a 2019 study published in Obesity Journal on [id number] patients concluded that "recommendations to limit phentermine to less than 3 months do not align with current concepts of pharmacologic treatment of obesity", and that "long term phentermine users experience greater weight loss without apparent increases in cardiovascular risk".  The potential risks and benefits of phentermine have been reviewed with the patient, and alternative treatment options were discussed. All questions were answered, and the patient wishes to move forward with this medication.  I have consulted the [place] Controlled Substances Registry for this patient, and feel the risk/benefit ratio today is favorable for proceeding with this prescription for a controlled substance. The patient understands monitoring parameters and red flags.   3. Other depression with emotional eating Improving.  [name] is taking Wellbutrin XL 150 mg daily.  - Refill buPROPion (WELLBUTRIN XL) 150 MG 24 hr tablet; Take 1 tablet (150 mg total) by mouth daily.  Dispense:  90 tablet; Refill: 0  4. At risk for dehydration [name] was given approximately 15 minutes dehydration prevention counseling today. [name] is at risk for dehydration due to  weight loss medication and doing a cleanse. She was encouraged to hydrate and monitor fluid status to avoid dehydration as well as weight loss plateaus.   5. Class 1 obesity with serious comorbidity and body mass index (BMI) of 32.0 to 32.9 in adult, unspecified obesity type  Course: Klani is currently in the action stage of change. As such, her goal is to continue with weight loss efforts.   Nutrition goals: She has agreed to keeping a food journal and adhering to recommended goals of 1000 calories and 85 grams of protein.   Exercise goals: For substantial health benefits, adults should do at least 150 minutes (2 hours and 30 minutes) a week of moderate-intensity, or 75 minutes (1 hour and 15 minutes) a week of vigorous-intensity aerobic physical activity, or an equivalent combination of moderate- and vigorous-intensity aerobic activity. Aerobic activity should be performed in episodes of at least 10 minutes, and preferably, it should be spread throughout the week.  Behavioral modification strategies: increasing lean protein intake, decreasing simple carbohydrates, increasing vegetables, dealing with family or coworker sabotage, travel eating strategies and holiday eating strategies .  [name] has agreed to follow-up with our clinic in 4 weeks. She was informed of the importance of frequent follow-up visits to maximize her success with intensive lifestyle modifications for her multiple health conditions.   Objective:   Blood pressure 125/86, pulse 88, temperature 98 F (36.7 C), temperature source Oral, height 5\' 2"  (1.575 m), weight 175 lb (79.4 kg), SpO2 97 %. Body mass index is 32.01 kg/m.  General: Cooperative, alert, well developed, in no acute distress. HEENT: Conjunctivae and lids unremarkable. Cardiovascular: Regular rhythm.  Lungs: Normal work of breathing. Neurologic: No focal deficits.   Lab Results  Component Value Date   CREATININE 0.87 01/23/2020   BUN 15 01/23/2020   NA  138 01/23/2020   K 4.5 01/23/2020   CL 99 01/23/2020   CO2 20 01/23/2020   Lab Results  Component Value Date   ALT 36 (H) 01/23/2020   AST 29 01/23/2020   ALKPHOS 71 01/23/2020   BILITOT 0.5 01/23/2020   Lab Results  Component Value Date   HGBA1C 6.0 (H) 01/23/2020   HGBA1C 6.0 (H) 08/21/2019   Lab Results  Component Value Date   INSULIN 12.4 08/21/2019   Lab Results  Component Value Date   TSH 2.280 08/21/2019   Lab Results  Component Value Date   CHOL 253 (H) 01/23/2020   HDL 44 01/23/2020   LDLCALC 191 (H) 01/23/2020   TRIG 103 01/23/2020   CHOLHDL 5.8 (H) 01/23/2020   Lab Results  Component Value Date   WBC 7.7 01/23/2020   HGB 16.8 (H) 01/23/2020   HCT 49.2 (H) 01/23/2020   MCV 85 01/23/2020   PLT 221 01/23/2020   Lab Results  Component Value Date   IRON 91 01/23/2020   TIBC 368 01/23/2020   FERRITIN 379 (H) 01/23/2020   Attestation Statements:   Reviewed by clinician on day of visit: allergies, medications, problem list, medical history, surgical history, family history, social history, and previous encounter notes.  I, 01/25/2020, CMA, am acting as transcriptionist for Insurance claims handler, DO  I have reviewed the above documentation for accuracy and completeness, and I agree with the above. Helane Rima, DO

## 2020-07-01 ENCOUNTER — Other Ambulatory Visit: Payer: Self-pay | Admitting: Cardiovascular Disease

## 2020-07-03 ENCOUNTER — Other Ambulatory Visit (INDEPENDENT_AMBULATORY_CARE_PROVIDER_SITE_OTHER): Payer: Self-pay | Admitting: Family Medicine

## 2020-07-03 ENCOUNTER — Other Ambulatory Visit: Payer: Self-pay | Admitting: Cardiovascular Disease

## 2020-07-03 DIAGNOSIS — F418 Other specified anxiety disorders: Secondary | ICD-10-CM

## 2020-07-03 DIAGNOSIS — F3289 Other specified depressive episodes: Secondary | ICD-10-CM

## 2020-07-03 DIAGNOSIS — R632 Polyphagia: Secondary | ICD-10-CM

## 2020-07-22 ENCOUNTER — Other Ambulatory Visit: Payer: Self-pay

## 2020-07-22 ENCOUNTER — Ambulatory Visit (INDEPENDENT_AMBULATORY_CARE_PROVIDER_SITE_OTHER): Payer: Commercial Managed Care - PPO | Admitting: Family Medicine

## 2020-07-23 ENCOUNTER — Ambulatory Visit (INDEPENDENT_AMBULATORY_CARE_PROVIDER_SITE_OTHER): Payer: Commercial Managed Care - PPO | Admitting: Psychology

## 2020-07-23 DIAGNOSIS — F4321 Adjustment disorder with depressed mood: Secondary | ICD-10-CM | POA: Diagnosis not present

## 2020-07-24 ENCOUNTER — Encounter (INDEPENDENT_AMBULATORY_CARE_PROVIDER_SITE_OTHER): Payer: Self-pay | Admitting: Family Medicine

## 2020-07-24 ENCOUNTER — Other Ambulatory Visit: Payer: Self-pay

## 2020-07-24 ENCOUNTER — Telehealth (INDEPENDENT_AMBULATORY_CARE_PROVIDER_SITE_OTHER): Payer: Commercial Managed Care - PPO | Admitting: Family Medicine

## 2020-07-24 DIAGNOSIS — F3289 Other specified depressive episodes: Secondary | ICD-10-CM

## 2020-07-24 DIAGNOSIS — R632 Polyphagia: Secondary | ICD-10-CM

## 2020-07-24 DIAGNOSIS — I10 Essential (primary) hypertension: Secondary | ICD-10-CM

## 2020-07-24 DIAGNOSIS — Z6832 Body mass index (BMI) 32.0-32.9, adult: Secondary | ICD-10-CM

## 2020-07-24 DIAGNOSIS — E669 Obesity, unspecified: Secondary | ICD-10-CM | POA: Diagnosis not present

## 2020-07-24 DIAGNOSIS — Z9189 Other specified personal risk factors, not elsewhere classified: Secondary | ICD-10-CM

## 2020-07-24 MED ORDER — PHENTERMINE HCL 37.5 MG PO TABS: 37.5000 mg | ORAL_TABLET | Freq: Every day | ORAL | 0 refills | Status: DC

## 2020-07-24 NOTE — Progress Notes (Signed)
TeleHealth Visit:  Due to the COVID-19 pandemic, this visit was completed with telemedicine (audio/video) technology to reduce patient and provider exposure as well as to preserve personal protective equipment.   [name] has verbally consented to this TeleHealth visit. The patient is located at home, the provider is located at the [facility] office. The participants in this visit include the listed provider and patient. The visit was conducted today via MyChart video.  Chief Complaint: OBESITY [name] is here to discuss her progress with her obesity treatment plan along with follow-up of her obesity related diagnoses. [name] is on keeping a food journal and adhering to recommended goals of 1000 calories and 85 grams of protein and states she is following her eating plan approximately 0%% of the time. [name] states she is not exercising regularly at this time.  Today's visit was #: 14 Starting weight: 185 lbs Starting date: 08/21/2019  Interim History: [name] says she has been doing less stress eating.  She has had no ice cream.  She also says she is, "not eating what I'm supposed to be eating".  She reports that she is getting back into a rhythm with her protein.  She is drinking 1 bottle of water every 3 days.  She says she signed up for yoga 3 days per week.  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  Assessment/Plan:   1. Polyphagia Improving, but not optimized. She will continue to focus on protein-rich, low simple carbohydrate foods. We reviewed the importance of hydration, regular exercise for stress reduction, and restorative sleep.  - Refill phentermine (ADIPEX-P) 37.5 MG tablet; Take 1 tablet (37.5 mg total) by mouth daily before breakfast.  Dispense: 30 tablet; Refill: 0  Having again reminded the patient of the "off label" use of Phentermine beyond three consecutive months, and again discussing the risks, benefits, contraindications, and limitations of it's use; given it's role in the  successful treatment of obesity thus far and lack of adverse effect, patient has expressed desire and given informed verbal consent to continue use.   I have consulted the [place] Controlled Substances Registry for this patient, and feel the risk/benefit ratio today is favorable for proceeding with this prescription for a controlled substance. The patient understands monitoring parameters and red flags.   2. Essential hypertension At goal. Medications: labetalol 300 mg daily.   Plan: Avoid buying foods that are: processed, frozen, or prepackaged to avoid excess salt. We will continue to monitor symptoms as they relate to her weight loss journey.  BP Readings from Last 3 Encounters:  06/18/20 125/86  05/06/20 115/78  04/15/20 112/78   Lab Results  Component Value Date   CREATININE 0.87 01/23/2020   3. Other depression with emotional eating [name] is taking Wellbutrin XL 150 mg daily.  Behavior modification techniques were discussed today to help [name] deal with her emotional/non-hunger eating behaviors.    4. Class 1 obesity with serious comorbidity and body mass index (BMI) of 32.0 to 32.9 in adult, unspecified obesity type  [name] is currently in the action stage of change. As such, her goal is to continue with weight loss efforts. She has agreed to practicing portion control and making smarter food choices, such as increasing vegetables and decreasing simple carbohydrates.   Exercise goals: For substantial health benefits, adults should do at least 150 minutes (2 hours and 30 minutes) a week of moderate-intensity, or 75 minutes (1 hour and 15 minutes) a week of vigorous-intensity aerobic physical activity, or an equivalent  combination of moderate- and vigorous-intensity aerobic activity. Aerobic activity should be performed in episodes of at least 10 minutes, and preferably, it should be spread throughout the week.  Behavioral modification strategies: increasing lean protein intake, decreasing  simple carbohydrates, increasing vegetables, increasing water intake, decreasing liquid calories, decreasing alcohol intake, decreasing sodium intake and increasing high fiber foods.  [name] has agreed to follow-up with our clinic in 3 weeks. She was informed of the importance of frequent follow-up visits to maximize her success with intensive lifestyle modifications for her multiple health conditions.  Objective:   VITALS: Per patient if applicable, see vitals. GENERAL: Alert and in no acute distress. CARDIOPULMONARY: No increased WOB. Speaking in clear sentences.  PSYCH: Pleasant and cooperative. Speech normal rate and rhythm. Affect is appropriate. Insight and judgement are appropriate. Attention is focused, linear, and appropriate.  NEURO: Oriented as arrived to appointment on time with no prompting.   Lab Results  Component Value Date   CREATININE 0.87 01/23/2020   BUN 15 01/23/2020   NA 138 01/23/2020   K 4.5 01/23/2020   CL 99 01/23/2020   CO2 20 01/23/2020   Lab Results  Component Value Date   ALT 36 (H) 01/23/2020   AST 29 01/23/2020   ALKPHOS 71 01/23/2020   BILITOT 0.5 01/23/2020   Lab Results  Component Value Date   HGBA1C 6.0 (H) 01/23/2020   HGBA1C 6.0 (H) 08/21/2019   Lab Results  Component Value Date   INSULIN 12.4 08/21/2019   Lab Results  Component Value Date   TSH 2.280 08/21/2019   Lab Results  Component Value Date   CHOL 253 (H) 01/23/2020   HDL 44 01/23/2020   LDLCALC 191 (H) 01/23/2020   TRIG 103 01/23/2020   CHOLHDL 5.8 (H) 01/23/2020   Lab Results  Component Value Date   WBC 7.7 01/23/2020   HGB 16.8 (H) 01/23/2020   HCT 49.2 (H) 01/23/2020   MCV 85 01/23/2020   PLT 221 01/23/2020   Lab Results  Component Value Date   IRON 91 01/23/2020   TIBC 368 01/23/2020   FERRITIN 379 (H) 01/23/2020   Attestation Statements:   Reviewed by clinician on day of visit: allergies, medications, problem list, medical history, surgical history,  family history, social history, and previous encounter notes.  I, Insurance claims handler, CMA, am acting as transcriptionist for Helane Rima, DO  I have reviewed the above documentation for accuracy and completeness, and I agree with the above. Helane Rima, DO

## 2020-08-14 ENCOUNTER — Ambulatory Visit: Payer: Commercial Managed Care - PPO | Admitting: Psychology

## 2020-08-16 ENCOUNTER — Other Ambulatory Visit: Payer: Self-pay | Admitting: Cardiovascular Disease

## 2020-08-18 ENCOUNTER — Ambulatory Visit (INDEPENDENT_AMBULATORY_CARE_PROVIDER_SITE_OTHER): Payer: Commercial Managed Care - PPO | Admitting: Family Medicine

## 2020-08-18 ENCOUNTER — Other Ambulatory Visit: Payer: Self-pay

## 2020-08-18 ENCOUNTER — Encounter (INDEPENDENT_AMBULATORY_CARE_PROVIDER_SITE_OTHER): Payer: Self-pay | Admitting: Family Medicine

## 2020-08-18 VITALS — BP 133/86 | HR 81 | Temp 98.0°F | Ht 62.0 in | Wt 177.0 lb

## 2020-08-18 DIAGNOSIS — E669 Obesity, unspecified: Secondary | ICD-10-CM | POA: Diagnosis not present

## 2020-08-18 DIAGNOSIS — E559 Vitamin D deficiency, unspecified: Secondary | ICD-10-CM | POA: Diagnosis not present

## 2020-08-18 DIAGNOSIS — Z9189 Other specified personal risk factors, not elsewhere classified: Secondary | ICD-10-CM | POA: Diagnosis not present

## 2020-08-18 DIAGNOSIS — Z6832 Body mass index (BMI) 32.0-32.9, adult: Secondary | ICD-10-CM

## 2020-08-18 DIAGNOSIS — F3289 Other specified depressive episodes: Secondary | ICD-10-CM

## 2020-08-18 DIAGNOSIS — R7303 Prediabetes: Secondary | ICD-10-CM

## 2020-08-18 MED ORDER — BUPROPION HCL ER (XL) 150 MG PO TB24: 150.0000 mg | ORAL_TABLET | Freq: Every day | ORAL | 0 refills | Status: DC

## 2020-08-20 NOTE — Progress Notes (Signed)
Chief Complaint:   OBESITY [name] is here to discuss her progress with her obesity treatment plan along with follow-up of her obesity related diagnoses.   Today's visit was #: 15 Starting weight: 185 lbs Starting date: 08/21/2019 Today's weight: 177 lbs Today's date: 08/18/2020 Total lbs lost to date: 8 lbs Body mass index is 32.37 kg/m.  Total weight loss percentage to date: -4.32%  Interim History: [name] says that her brother will be helping her with organizing next weekend.  She reports that she is feeling stuck.  She says she has been doing some overnight eating.  Nutrition Plan: practicing portion control and making smarter food choices, such as increasing vegetables and decreasing simple carbohydrates for 70% of the time. Anti-obesity medications: phentermine. Reported side effects: None. Activity: None at this time.  Assessment/Plan:   1. Vitamin D deficiency Improving, but not optimized. Current vitamin D is 42.3, tested on 01/23/2020. Optimal goal > 50 ng/dL.   Plan: Continue to take prescription Vitamin D @50 ,000 IU every week as prescribed.  Follow-up for routine testing of Vitamin D, at least 2-3 times per year to avoid over-replacement.  2. Prediabetes Not at goal. Goal is HgbA1c < 5.7.  Medication: None.   Plan:  She will continue to focus on protein-rich, low simple carbohydrate foods. We reviewed the importance of hydration, regular exercise for stress reduction, and restorative sleep.   Lab Results  Component Value Date   HGBA1C 6.0 (H) 01/23/2020   Lab Results  Component Value Date   INSULIN 12.4 08/21/2019   3. Other depression, with emotional eating Not at goal. Medication: Wellbutrin XL 150 mg daily.  Plan:  Behavior modification techniques were discussed today to help deal with emotional/non-hunger eating behaviors.  - Refill buPROPion (WELLBUTRIN XL) 150 MG 24 hr tablet; Take 1 tablet (150 mg total) by mouth daily.  Dispense: 90 tablet; Refill:  0  4. At risk for malnutrition [name] was given extensive malnutrition prevention education and counseling today of more than 8 minutes.  Counseled her that malnutrition refers to inappropriate nutrients or not the right balance of nutrients for optimal health.  Discussed with [name] that it is absolutely possible to be malnourished but yet obese.  Risk factors, including but not limited to, inappropriate dietary choices, difficulty with obtaining food due to physical or financial limitations, and various physical and mental health conditions were reviewed with [name].   5. Class 1 obesity with serious comorbidity and body mass index (BMI) of 32.0 to 32.9 in adult, unspecified obesity type  Course: [name] is currently in the action stage of change. As such, her goal is to continue with weight loss efforts.   Nutrition goals:  She has agreed to keeping a food journal and adhering to recommended goals of 1000 calories and 85 grams of protein.   Exercise goals: For substantial health benefits, adults should do at least 150 minutes (2 hours and 30 minutes) a week of moderate-intensity, or 75 minutes (1 hour and 15 minutes) a week of vigorous-intensity aerobic physical activity, or an equivalent combination of moderate- and vigorous-intensity aerobic activity. Aerobic activity should be performed in episodes of at least 10 minutes, and preferably, it should be spread throughout the week. Yoga starting on Sunday.  Behavioral modification strategies: increasing lean protein intake, decreasing simple carbohydrates, increasing vegetables, increasing water intake, decreasing liquid calories, decreasing alcohol intake and decreasing sodium intake.  [name] has agreed to follow-up with our clinic in 3  weeks. She was informed of the importance of frequent follow-up visits to maximize her success with intensive lifestyle modifications for her multiple health conditions.   Objective:   Blood  pressure 133/86, pulse 81, temperature 98 F (36.7 C), temperature source Oral, height 5\' 2"  (1.575 m), weight 177 lb (80.3 kg), SpO2 97 %. Body mass index is 32.37 kg/m.  General: Cooperative, alert, well developed, in no acute distress. HEENT: Conjunctivae and lids unremarkable. Cardiovascular: Regular rhythm.  Lungs: Normal work of breathing. Neurologic: No focal deficits.   Lab Results  Component Value Date   CREATININE 0.87 01/23/2020   BUN 15 01/23/2020   NA 138 01/23/2020   K 4.5 01/23/2020   CL 99 01/23/2020   CO2 20 01/23/2020   Lab Results  Component Value Date   ALT 36 (H) 01/23/2020   AST 29 01/23/2020   ALKPHOS 71 01/23/2020   BILITOT 0.5 01/23/2020   Lab Results  Component Value Date   HGBA1C 6.0 (H) 01/23/2020   HGBA1C 6.0 (H) 08/21/2019   Lab Results  Component Value Date   INSULIN 12.4 08/21/2019   Lab Results  Component Value Date   TSH 2.280 08/21/2019   Lab Results  Component Value Date   CHOL 253 (H) 01/23/2020   HDL 44 01/23/2020   LDLCALC 191 (H) 01/23/2020   TRIG 103 01/23/2020   CHOLHDL 5.8 (H) 01/23/2020   Lab Results  Component Value Date   WBC 7.7 01/23/2020   HGB 16.8 (H) 01/23/2020   HCT 49.2 (H) 01/23/2020   MCV 85 01/23/2020   PLT 221 01/23/2020   Lab Results  Component Value Date   IRON 91 01/23/2020   TIBC 368 01/23/2020   FERRITIN 379 (H) 01/23/2020   Attestation Statements:   Reviewed by clinician on day of visit: allergies, medications, problem list, medical history, surgical history, family history, social history, and previous encounter notes.  I, 01/25/2020, CMA, am acting as transcriptionist for Insurance claims handler, DO  I have reviewed the above documentation for accuracy and completeness, and I agree with the above. Helane Rima, DO

## 2020-09-04 ENCOUNTER — Ambulatory Visit: Payer: Commercial Managed Care - PPO | Admitting: Psychology

## 2020-09-10 ENCOUNTER — Ambulatory Visit (INDEPENDENT_AMBULATORY_CARE_PROVIDER_SITE_OTHER): Payer: Commercial Managed Care - PPO | Admitting: Psychology

## 2020-09-10 DIAGNOSIS — F4321 Adjustment disorder with depressed mood: Secondary | ICD-10-CM

## 2020-09-17 ENCOUNTER — Ambulatory Visit (INDEPENDENT_AMBULATORY_CARE_PROVIDER_SITE_OTHER): Payer: Commercial Managed Care - PPO | Admitting: Family Medicine

## 2020-09-22 ENCOUNTER — Encounter (INDEPENDENT_AMBULATORY_CARE_PROVIDER_SITE_OTHER): Payer: Self-pay | Admitting: Family Medicine

## 2020-09-22 ENCOUNTER — Other Ambulatory Visit: Payer: Self-pay

## 2020-09-22 ENCOUNTER — Ambulatory Visit (INDEPENDENT_AMBULATORY_CARE_PROVIDER_SITE_OTHER): Payer: Commercial Managed Care - PPO | Admitting: Family Medicine

## 2020-09-22 VITALS — BP 126/86 | HR 89 | Temp 98.1°F | Ht 62.0 in | Wt 178.0 lb

## 2020-09-22 DIAGNOSIS — R948 Abnormal results of function studies of other organs and systems: Secondary | ICD-10-CM

## 2020-09-22 DIAGNOSIS — Z7289 Other problems related to lifestyle: Secondary | ICD-10-CM | POA: Diagnosis not present

## 2020-09-22 DIAGNOSIS — R632 Polyphagia: Secondary | ICD-10-CM

## 2020-09-22 DIAGNOSIS — F411 Generalized anxiety disorder: Secondary | ICD-10-CM

## 2020-09-22 DIAGNOSIS — Z9189 Other specified personal risk factors, not elsewhere classified: Secondary | ICD-10-CM

## 2020-09-22 DIAGNOSIS — Z6832 Body mass index (BMI) 32.0-32.9, adult: Secondary | ICD-10-CM

## 2020-09-22 DIAGNOSIS — E669 Obesity, unspecified: Secondary | ICD-10-CM

## 2020-09-22 DIAGNOSIS — Z789 Other specified health status: Secondary | ICD-10-CM

## 2020-09-23 MED ORDER — PROPRANOLOL HCL 20 MG PO TABS: 20.0000 mg | ORAL_TABLET | Freq: Three times a day (TID) | ORAL | 0 refills | Status: DC | PRN

## 2020-09-23 MED ORDER — PHENTERMINE HCL 37.5 MG PO TABS: 37.5000 mg | ORAL_TABLET | Freq: Every day | ORAL | 0 refills | Status: DC

## 2020-09-25 NOTE — Progress Notes (Signed)
Chief Complaint:   OBESITY [name] is here to discuss her progress with her obesity treatment plan along with follow-up of her obesity related diagnoses.   Today's visit was #: 16 Starting weight: 185 lbs Starting date: 08/21/2019 Today's weight: 178 lbs Today's date: 09/22/2020 Total lbs lost to date: 7 lbs Body mass index is 32.56 kg/m.  Total weight loss percentage to date: -3.78%  Interim History:  [name] says she has cravings for [employer], fried chicken, and [employer] Honey Butter Chicken Biscuit.  She says she is happy with her progress but has scale anxiety.  She is feeling stuck.  MFP x3 days ~1300 calories. Barrier:  Settling into her new home. Plan:  Food delivery. Homework:  Natural snacks such as oranges, grapes, raisins, nuts.  Current Meal Plan: keeping a food journal and adhering to recommended goals of 1000 calories and 85 grams of protein for 40% of the time.  Current Exercise Plan: Increased activity. Current Anti-Obesity Medications: phentermine 37.5 mg daily. Side effects: None.  Assessment/Plan:   1. Abnormal metabolism [name]'s metabolism is lower than normal. She will continue to focus on protein-rich, low simple carbohydrate foods. We reviewed the importance of hydration, regular exercise for stress reduction, and restorative sleep.   2. Polyphagia Controlled.  She says that phentermine is still helpful.   - Refill phentermine (ADIPEX-P) 37.5 MG tablet; Take 1 tablet (37.5 mg total) by mouth daily before breakfast.  Dispense: 90 tablet; Refill: 0  Having again reminded the patient of the "off label" use of Phentermine beyond three consecutive months, and again discussing the risks, benefits, contraindications, and limitations of it's use; given it's role in the successful treatment of obesity thus far and lack of adverse effect, patient has expressed desire and given informed verbal consent to continue use.   I have consulted the [place] Controlled Substances  Registry for this patient, and feel the risk/benefit ratio today is favorable for proceeding with this prescription for a controlled substance. The patient understands monitoring parameters and red flags.   3. Alcohol use Discussed decreasing alcohol use.  She is considering Contrave. We will continue to monitor symptoms as they relate to her weight loss journey.  4. GAD (generalized anxiety disorder) She says she is drinking wine less often, but sometimes still drinks a bottle.  Her trigger is feeling overwhelmed.  She tried Klonopin and says she did not feel good (felt down).  Plan:  Start propranolol 20 mg three times daily as needed for anxiety, as per below.  Behavior modification techniques were discussed today to help [name] deal with her anxiety.    - Start propranolol (INDERAL) 20 MG tablet; Take 1 tablet (20 mg total) by mouth 3 (three) times daily as needed.  Dispense: 30 tablet; Refill: 0  5. At risk for heart disease Due to [name]'s current state of health and medical condition(s), she is at a higher risk for heart disease.  This puts the patient at much greater risk to subsequently develop cardiopulmonary conditions that can significantly affect patient's quality of life in a negative manner.    At least 9 minutes were spent on counseling Tykeshia about these concerns today. Evidence-based interventions for health behavior change were utilized today including the discussion of self monitoring techniques, problem-solving barriers, and SMART goal setting techniques.  Specifically, regarding patient's less desirable eating habits and patterns, we employed the technique of small changes when [name] has not been able to fully commit to her prudent nutritional  plan.  6. Class 1 obesity with serious comorbidity and body mass index (BMI) of 32.0 to 32.9 in adult, unspecified obesity type  Course: [name] is currently in the action stage of change. As such, her goal is to continue with weight loss  efforts.   Nutrition goals: She has agreed to keeping a food journal and adhering to recommended goals of 1000 calories and 85 grams of protein.   Exercise goals: For substantial health benefits, adults should do at least 150 minutes (2 hours and 30 minutes) a week of moderate-intensity, or 75 minutes (1 hour and 15 minutes) a week of vigorous-intensity aerobic physical activity, or an equivalent combination of moderate- and vigorous-intensity aerobic activity. Aerobic activity should be performed in episodes of at least 10 minutes, and preferably, it should be spread throughout the week.  Behavioral modification strategies: increasing lean protein intake, decreasing simple carbohydrates, increasing vegetables, increasing water intake, decreasing liquid calories and decreasing alcohol intake.  [name] has agreed to follow-up with our clinic in 4 weeks. She was informed of the importance of frequent follow-up visits to maximize her success with intensive lifestyle modifications for her multiple health conditions.   Objective:   Blood pressure 126/86, pulse 89, temperature 98.1 F (36.7 C), temperature source Oral, height 5\' 2"  (1.575 m), weight 178 lb (80.7 kg), SpO2 97 %. Body mass index is 32.56 kg/m.  General: Cooperative, alert, well developed, in no acute distress. HEENT: Conjunctivae and lids unremarkable. Cardiovascular: Regular rhythm.  Lungs: Normal work of breathing. Neurologic: No focal deficits.   Lab Results  Component Value Date   CREATININE 0.87 01/23/2020   BUN 15 01/23/2020   NA 138 01/23/2020   K 4.5 01/23/2020   CL 99 01/23/2020   CO2 20 01/23/2020   Lab Results  Component Value Date   ALT 36 (H) 01/23/2020   AST 29 01/23/2020   ALKPHOS 71 01/23/2020   BILITOT 0.5 01/23/2020   Lab Results  Component Value Date   HGBA1C 6.0 (H) 01/23/2020   HGBA1C 6.0 (H) 08/21/2019   Lab Results  Component Value Date   INSULIN 12.4 08/21/2019   Lab Results  Component  Value Date   TSH 2.280 08/21/2019   Lab Results  Component Value Date   CHOL 253 (H) 01/23/2020   HDL 44 01/23/2020   LDLCALC 191 (H) 01/23/2020   TRIG 103 01/23/2020   CHOLHDL 5.8 (H) 01/23/2020   Lab Results  Component Value Date   WBC 7.7 01/23/2020   HGB 16.8 (H) 01/23/2020   HCT 49.2 (H) 01/23/2020   MCV 85 01/23/2020   PLT 221 01/23/2020   Lab Results  Component Value Date   IRON 91 01/23/2020   TIBC 368 01/23/2020   FERRITIN 379 (H) 01/23/2020   Attestation Statements:   Reviewed by clinician on day of visit: allergies, medications, problem list, medical history, surgical history, family history, social history, and previous encounter notes.  I, 01/25/2020, CMA, am acting as transcriptionist for Insurance claims handler, DO  I have reviewed the above documentation for accuracy and completeness, and I agree with the above. Helane Rima, DO

## 2020-10-01 ENCOUNTER — Other Ambulatory Visit: Payer: Self-pay | Admitting: Cardiovascular Disease

## 2020-10-06 ENCOUNTER — Other Ambulatory Visit (INDEPENDENT_AMBULATORY_CARE_PROVIDER_SITE_OTHER): Payer: Self-pay | Admitting: Family Medicine

## 2020-10-06 DIAGNOSIS — R632 Polyphagia: Secondary | ICD-10-CM

## 2020-10-06 DIAGNOSIS — E559 Vitamin D deficiency, unspecified: Secondary | ICD-10-CM

## 2020-10-06 MED ORDER — VITAMIN D (ERGOCALCIFEROL) 1.25 MG (50000 UNIT) PO CAPS: 50000.0000 [IU] | ORAL_CAPSULE | ORAL | 0 refills | Status: DC

## 2020-10-06 MED ORDER — PHENTERMINE HCL 37.5 MG PO TABS: 37.5000 mg | ORAL_TABLET | Freq: Every day | ORAL | 0 refills | Status: DC

## 2020-10-08 ENCOUNTER — Encounter (INDEPENDENT_AMBULATORY_CARE_PROVIDER_SITE_OTHER): Payer: Self-pay

## 2020-10-08 ENCOUNTER — Telehealth (INDEPENDENT_AMBULATORY_CARE_PROVIDER_SITE_OTHER): Payer: Commercial Managed Care - PPO | Admitting: Adult Health

## 2020-10-08 ENCOUNTER — Encounter (INDEPENDENT_AMBULATORY_CARE_PROVIDER_SITE_OTHER): Payer: Self-pay | Admitting: Adult Health

## 2020-10-08 ENCOUNTER — Other Ambulatory Visit: Payer: Self-pay

## 2020-10-08 DIAGNOSIS — R632 Polyphagia: Secondary | ICD-10-CM

## 2020-10-08 DIAGNOSIS — E669 Obesity, unspecified: Secondary | ICD-10-CM

## 2020-10-08 DIAGNOSIS — Z6834 Body mass index (BMI) 34.0-34.9, adult: Secondary | ICD-10-CM | POA: Diagnosis not present

## 2020-10-08 DIAGNOSIS — E559 Vitamin D deficiency, unspecified: Secondary | ICD-10-CM | POA: Diagnosis not present

## 2020-10-08 DIAGNOSIS — E66811 Obesity, class 1: Secondary | ICD-10-CM | POA: Insufficient documentation

## 2020-10-13 NOTE — Progress Notes (Signed)
TeleHealth Visit:  Due to the COVID-19 pandemic, this visit was completed with telemedicine (audio/video) technology to reduce patient and provider exposure as well as to preserve personal protective equipment.   [name] has verbally consented to this TeleHealth visit. The patient is located at home, the provider is located at the [facility] office. The participants in this visit include the listed provider and patient. The visit was conducted today via video.   Chief Complaint: OBESITY [name] is here to discuss her progress with her obesity treatment plan along with follow-up of her obesity related diagnoses. [name] is on keeping a food journal and adhering to recommended goals of 1000 calories and 85 g protein and states she is following her eating plan approximately 80% of the time. [name] states she is doing 0 minutes 0 times per week.  Today's visit was #: 17 Starting weight: 185 lbs Starting date: 08/21/2019  Interim History: [name] and her daughter are both experiencing nasal drainage.  Her daughter is reporting nasal drainage, sore throat, and reduced taste.  She and her daughter have remained home with acute sx's. She has been tracking more consistently, with average calorie intake 1200-1300 per day and protein intake 45-60 grams per day.  Subjective:   1. Vitamin D deficiency Delta's Vitamin D level was 42.3 on 01/23/2020. She is currently taking prescription vitamin D 50,000 IU each week. She received mail order prescription Vit D.   Ref. Range 01/23/2020 15:51  Vitamin D, 25-Hydroxy Latest Ref Range: 30.0 - 100.0 ng/mL 42.3   2. Polyphagia [name] received phentermine 37.5 mg Rx and resumed daily dose. She denies insomnia or increased BP. PDMP reviewed- no aberrancies noted.  Assessment/Plan:   1. Vitamin D deficiency Low Vitamin D level contributes to fatigue and are associated with obesity, breast, and colon cancer. She agrees to continue to take prescription  Vitamin D @50 ,000 IU every week and will follow-up for routine testing of Vitamin D, at least 2-3 times per year to avoid over-replacement.  2. Polyphagia Intensive lifestyle modifications are the first line treatment for this issue. We discussed several lifestyle modifications today and she will continue to work on diet, exercise and weight loss efforts. Orders and follow up as documented in patient record. Continue phentermine as directed.  Counseling . Polyphagia is excessive hunger. . Causes can include: low blood sugars, hypERthyroidism, PMS, lack of sleep, stress, insulin resistance, diabetes, certain medications, and diets that are deficient in protein and fiber.   3. Class 1 obesity with serious comorbidity and body mass index (BMI) of 34.0 to 34.9 in adult, unspecified obesity type [name] is currently in the action stage of change. As such, her goal is to continue with weight loss efforts. She has agreed to keeping a food journal and adhering to recommended goals of 1000 calories and 85 g protein.   When daughter is at sports events/classes, use that time to exercise.  Exercise goals: As is  Behavioral modification strategies: increasing lean protein intake, decreasing simple carbohydrates, meal planning and cooking strategies, planning for success and keeping a strict food journal.  [name] has agreed to follow-up with our clinic in 2 weeks. She was informed of the importance of frequent follow-up visits to maximize her success with intensive lifestyle modifications for her multiple health conditions.  Objective:   VITALS: Per patient if applicable, see vitals. GENERAL: Alert and in no acute distress. CARDIOPULMONARY: No increased WOB. Speaking in clear sentences.  PSYCH: Pleasant and cooperative. Speech normal  rate and rhythm. Affect is appropriate. Insight and judgement are appropriate. Attention is focused, linear, and appropriate.  NEURO: Oriented as arrived to appointment on  time with no prompting.   Lab Results  Component Value Date   CREATININE 0.87 01/23/2020   BUN 15 01/23/2020   NA 138 01/23/2020   K 4.5 01/23/2020   CL 99 01/23/2020   CO2 20 01/23/2020   Lab Results  Component Value Date   ALT 36 (H) 01/23/2020   AST 29 01/23/2020   ALKPHOS 71 01/23/2020   BILITOT 0.5 01/23/2020   Lab Results  Component Value Date   HGBA1C 6.0 (H) 01/23/2020   HGBA1C 6.0 (H) 08/21/2019   Lab Results  Component Value Date   INSULIN 12.4 08/21/2019   Lab Results  Component Value Date   TSH 2.280 08/21/2019   Lab Results  Component Value Date   CHOL 253 (H) 01/23/2020   HDL 44 01/23/2020   LDLCALC 191 (H) 01/23/2020   TRIG 103 01/23/2020   CHOLHDL 5.8 (H) 01/23/2020   Lab Results  Component Value Date   WBC 7.7 01/23/2020   HGB 16.8 (H) 01/23/2020   HCT 49.2 (H) 01/23/2020   MCV 85 01/23/2020   PLT 221 01/23/2020   Lab Results  Component Value Date   IRON 91 01/23/2020   TIBC 368 01/23/2020   FERRITIN 379 (H) 01/23/2020    Attestation Statements:   Reviewed by clinician on day of visit: allergies, medications, problem list, medical history, surgical history, family history, social history, and previous encounter notes.  Time spent on visit including pre-visit chart review and post-visit charting and care was 31 minutes.   Edmund Hilda, am acting as Energy manager for William Hamburger, NP.  I have reviewed the above documentation for accuracy and completeness, and I agree with the above. - Graceland Wachter d. Adalaide Jaskolski, NP-C

## 2020-10-16 ENCOUNTER — Ambulatory Visit (INDEPENDENT_AMBULATORY_CARE_PROVIDER_SITE_OTHER): Payer: Commercial Managed Care - PPO | Admitting: Psychology

## 2020-10-16 DIAGNOSIS — F4321 Adjustment disorder with depressed mood: Secondary | ICD-10-CM

## 2020-10-21 ENCOUNTER — Encounter: Payer: Self-pay | Admitting: Cardiovascular Disease

## 2020-10-21 ENCOUNTER — Ambulatory Visit: Payer: Commercial Managed Care - PPO | Admitting: Cardiovascular Disease

## 2020-10-21 ENCOUNTER — Other Ambulatory Visit: Payer: Self-pay

## 2020-10-21 VITALS — BP 124/90 | HR 68 | Ht 62.0 in | Wt 185.6 lb

## 2020-10-21 DIAGNOSIS — I10 Essential (primary) hypertension: Secondary | ICD-10-CM

## 2020-10-21 DIAGNOSIS — E782 Mixed hyperlipidemia: Secondary | ICD-10-CM

## 2020-10-21 MED ORDER — SPIRONOLACTONE 50 MG PO TABS: 50.0000 mg | ORAL_TABLET | Freq: Every day | ORAL | 3 refills | Status: DC

## 2020-10-21 NOTE — Patient Instructions (Signed)

## 2020-10-21 NOTE — Progress Notes (Signed)
[name] Date of Birth  [birth year] [facility] [address]    [address] [place], [place]  [address] [phone]  Fax  [phone]   Problem list 1. Essential hypertension History of Present Illness:  05/23/12:; 48 a female with a history of hypertension. She has done very well. She was started on labetalol. She tolerates this very well.  She's not had any episodes of chest pain or shortness of breath.  She has a cold now and is on Claritin -D.  She has not been exercising regularly - occasionally once a week.  [name] 20, 2015:  [name] is doing ok  Her father had a CVA and she is having to deal with this issue.    She has not been checking her BP.  She has been feeling well.    Dec. 30, 2015:  [name] is a 50 yo with hx of HTN .  BP is up a bit today because she has not taken her meds today.   Dec. 9, 2016: Doing ok.   Has not been working out as much .  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  Does not always remember to take the 2nd Labetalol tablet No CP or dyspnea.    September 07, 2018: [name]  is seen today after a 2 -year absence.  She saw [name] last year BP has been a little higher . Does not take her labetalol  BID every day  Tries to avoid table salt,   Admits to eating a poor diet Eats pre-packaged and processed foods.    Knows she needs to work out more .  Has tried working out but has slacked off   June 14, 2019: [name] is seen today for follow-up of her hypertension.  I saw her in February and we started her on spironolactone. She has been on labetalol 300 mg twice a day. She ran out of her spironolactone last week .  Has been taking her Labetalol   Her bp when she was on both meds was 130s - 140s Diastolic was 80-90 Felt lightheaded today.    October 21, 2020: [name] is seen today for follow-up of her hypertension. Was  running low on her spironolactone so she was taking it every other day .  Not getting regular exercise ,  Still raising her 50 yo  Going to [facility].   Advised her to avoid foods that are white, wheat, sweat .    Current Outpatient Medications on File Prior to Visit  Medication Sig Dispense Refill  . albuterol (PROVENTIL HFA;VENTOLIN HFA) 108 (90 Base) MCG/ACT inhaler Inhale 2 puffs into the lungs every 4 (four) hours as needed.     Marland Kitchen buPROPion (WELLBUTRIN XL) 150 MG 24 hr tablet Take 1 tablet (150 mg total) by mouth daily. 90 tablet 0  . cetirizine (ZYRTEC) 10 MG tablet Take 10 mg by mouth daily as needed.     . hyoscyamine (LEVSIN SL) 0.125 MG SL tablet PLACE 1 TABLET UNDER THE TONGUE AND ALLOW TO DISSOLVE AS NEEDED EVERY 4 HRS IF NEEDED  0  . ibuprofen (ADVIL) 200 MG tablet Take 200 mg by mouth every 6 (six) hours as needed.    . labetalol (NORMODYNE) 300 MG tablet TAKE 1 TABLET BY MOUTH  TWICE DAILY 60 tablet 0  . Multiple Vitamin (MULTIVITAMIN)  capsule Take 1 capsule by mouth daily.    . naproxen sodium (ALEVE) 220 MG tablet Take 220 mg by mouth daily as needed.    . phentermine (ADIPEX-P) 37.5 MG tablet Take 1 tablet (37.5 mg total) by mouth daily before breakfast. 30 tablet 0  . propranolol (INDERAL) 20 MG tablet Take 1 tablet (20 mg total) by mouth 3 (three) times daily as needed. 30 tablet 0  . spironolactone (ALDACTONE) 50 MG tablet Take 1 tablet (50 mg total) by mouth daily. Please call and schedule overdue follow up appointment for further refills. 2nd attempt 15 tablet 0  . Turmeric 500 MG TABS Take by mouth.    . valACYclovir (VALTREX) 500 MG tablet Use as directed    . Vitamin D, Ergocalciferol, (DRISDOL) 1.25 MG (50000 UNIT) CAPS capsule Take 1 capsule (50,000 Units total) by mouth every 7 (seven) days. 12 capsule 0   No current facility-administered medications on file prior to visit.    Allergies  Allergen Reactions  . Motrin [Ibuprofen]  Swelling  . Other Other (See Comments)    Mold,cat & dog hair, raw carrots,raw apples, burch &mapple trees, and ragweed  . Penicillins Hives  . Sulfa Antibiotics Hives    Past Medical History:  Diagnosis Date  . Anemia   . Asthma   . Back pain   . Constipated   . GERD (gastroesophageal reflux disease)   . High cholesterol   . Hypertension   . IBS (irritable bowel syndrome)   . Joint pain   . Lactose intolerance   . Mitral valve prolapse   . Multiple food allergies   . Palpitation   . Panic attack   . Right knee pain   . Seasonal allergies     Past Surgical History:  Procedure Laterality Date  . BREAST FIBROADENOMA SURGERY    . BREAST LUMPECTOMY    . MYOMECTOMY      Social History   Tobacco Use  Smoking Status Never Smoker  Smokeless Tobacco Never Used    Social History   Substance and Sexual Activity  Alcohol Use Never    Family History  Problem Relation Age of Onset  . Hypertension Mother   . Hyperlipidemia Mother   . Diabetes Mother   . Heart disease Mother   . Cancer Mother   . Obesity Mother   . Hypertension Father   . Hyperlipidemia Father   . Diabetes Father   . Stroke Father   . Alcoholism Father   . Heart attack Maternal Uncle   . Diabetes Maternal Grandmother   . Hyperlipidemia Maternal Grandmother   . Hypertension Maternal Grandmother   . Sudden death Neg Hx     Reviw of Systems:  Reviewed in the HPI.  All other systems are negative.   Physical Exam: Blood pressure 124/90, pulse 68, height 5\' 2"  (1.575 m), weight 185 lb 9.6 oz (84.2 kg), SpO2 98 %.  GEN:  Well nourished, well developed in no acute distress HEENT: Normal NECK: No JVD; No carotid bruits LYMPHATICS: No lymphadenopathy CARDIAC: RRR , no murmurs, rubs, gallops RESPIRATORY:  Clear to auscultation without rales, wheezing or rhonchi  ABDOMEN: Soft, non-tender, non-distended MUSCULOSKELETAL:  No edema; No deformity  SKIN: Warm and dry NEUROLOGIC:  Alert and oriented  x 3    ECG: October 21, 2020: Normal sinus rhythm at 68.  No ST or T wave changes.     Assessment / Plan:   1. Essential hypertension: Overall blood pressure looks to  be well controlled.  Her diastolic pressures up slightly.  I suspect this is because she has been taking her spironolactone every other day.  She did this because she was about to run out of tablets.  I have encouraged her to continue with her current medications.  She needs to continue weight loss efforts.  She is participating in the [facility] healthy weight loss program.   2. Hyperlipidemia: -   This has been stable.  I will see her again in 1 year.     [name], MD  10/21/2020 9:44 AM    [facility] [address] [place],  [address] [place], [place]  [address] Pager [phone] Phone: [phone]; Fax: [phone]   [facility]  [address] [place], [place]  [address] [phone]   Fax [phone]

## 2020-10-27 ENCOUNTER — Other Ambulatory Visit (INDEPENDENT_AMBULATORY_CARE_PROVIDER_SITE_OTHER): Payer: Self-pay | Admitting: Family Medicine

## 2020-10-27 DIAGNOSIS — F411 Generalized anxiety disorder: Secondary | ICD-10-CM

## 2020-10-27 MED ORDER — PROPRANOLOL HCL 20 MG PO TABS: 20.0000 mg | ORAL_TABLET | Freq: Three times a day (TID) | ORAL | 0 refills | Status: DC | PRN

## 2020-10-27 NOTE — Telephone Encounter (Signed)
Refill request. Pt saw Orpha Bur last.

## 2020-10-27 NOTE — Telephone Encounter (Signed)
Pt last seen by Katy Danford, FNP.  

## 2020-11-03 ENCOUNTER — Ambulatory Visit (INDEPENDENT_AMBULATORY_CARE_PROVIDER_SITE_OTHER): Payer: Commercial Managed Care - PPO | Admitting: Family Medicine

## 2020-11-07 ENCOUNTER — Other Ambulatory Visit (INDEPENDENT_AMBULATORY_CARE_PROVIDER_SITE_OTHER): Payer: Self-pay | Admitting: Family Medicine

## 2020-11-07 DIAGNOSIS — R632 Polyphagia: Secondary | ICD-10-CM

## 2020-11-10 NOTE — Telephone Encounter (Signed)
Dr.Wallace °

## 2020-11-11 ENCOUNTER — Encounter (INDEPENDENT_AMBULATORY_CARE_PROVIDER_SITE_OTHER): Payer: Self-pay

## 2020-11-11 NOTE — Telephone Encounter (Signed)
Sent pt message

## 2020-11-18 ENCOUNTER — Other Ambulatory Visit (INDEPENDENT_AMBULATORY_CARE_PROVIDER_SITE_OTHER): Payer: Self-pay | Admitting: Family Medicine

## 2020-11-18 ENCOUNTER — Other Ambulatory Visit (INDEPENDENT_AMBULATORY_CARE_PROVIDER_SITE_OTHER): Payer: Self-pay | Admitting: Adult Health

## 2020-11-18 DIAGNOSIS — F3289 Other specified depressive episodes: Secondary | ICD-10-CM

## 2020-11-18 MED ORDER — BUPROPION HCL ER (XL) 150 MG PO TB24: 150.0000 mg | ORAL_TABLET | Freq: Every day | ORAL | 0 refills | Status: DC

## 2020-11-18 NOTE — Telephone Encounter (Signed)
Last seen by Katy 

## 2020-11-18 NOTE — Telephone Encounter (Signed)
Refill request

## 2020-11-19 ENCOUNTER — Ambulatory Visit: Payer: Commercial Managed Care - PPO | Admitting: Psychology

## 2020-11-25 ENCOUNTER — Ambulatory Visit (INDEPENDENT_AMBULATORY_CARE_PROVIDER_SITE_OTHER): Payer: Commercial Managed Care - PPO | Admitting: Family Medicine

## 2020-12-03 ENCOUNTER — Other Ambulatory Visit (INDEPENDENT_AMBULATORY_CARE_PROVIDER_SITE_OTHER): Payer: Self-pay | Admitting: Family Medicine

## 2020-12-03 DIAGNOSIS — E559 Vitamin D deficiency, unspecified: Secondary | ICD-10-CM

## 2020-12-03 NOTE — Telephone Encounter (Signed)
Last seen Julaine Fusi, NP

## 2020-12-13 ENCOUNTER — Other Ambulatory Visit (INDEPENDENT_AMBULATORY_CARE_PROVIDER_SITE_OTHER): Payer: Self-pay | Admitting: Family Medicine

## 2020-12-13 DIAGNOSIS — F3289 Other specified depressive episodes: Secondary | ICD-10-CM

## 2020-12-15 ENCOUNTER — Other Ambulatory Visit: Payer: Self-pay

## 2020-12-15 ENCOUNTER — Encounter (INDEPENDENT_AMBULATORY_CARE_PROVIDER_SITE_OTHER): Payer: Self-pay | Admitting: Adult Health

## 2020-12-15 ENCOUNTER — Ambulatory Visit (INDEPENDENT_AMBULATORY_CARE_PROVIDER_SITE_OTHER): Payer: Commercial Managed Care - PPO | Admitting: Adult Health

## 2020-12-15 VITALS — BP 134/84 | HR 77 | Temp 97.8°F | Ht 62.0 in | Wt 179.0 lb

## 2020-12-15 DIAGNOSIS — R632 Polyphagia: Secondary | ICD-10-CM | POA: Diagnosis not present

## 2020-12-15 DIAGNOSIS — I10 Essential (primary) hypertension: Secondary | ICD-10-CM

## 2020-12-15 DIAGNOSIS — E669 Obesity, unspecified: Secondary | ICD-10-CM

## 2020-12-15 DIAGNOSIS — E559 Vitamin D deficiency, unspecified: Secondary | ICD-10-CM | POA: Diagnosis not present

## 2020-12-15 DIAGNOSIS — Z9189 Other specified personal risk factors, not elsewhere classified: Secondary | ICD-10-CM | POA: Diagnosis not present

## 2020-12-15 DIAGNOSIS — R7303 Prediabetes: Secondary | ICD-10-CM

## 2020-12-15 DIAGNOSIS — D582 Other hemoglobinopathies: Secondary | ICD-10-CM | POA: Insufficient documentation

## 2020-12-15 DIAGNOSIS — Z6832 Body mass index (BMI) 32.0-32.9, adult: Secondary | ICD-10-CM

## 2020-12-15 DIAGNOSIS — F411 Generalized anxiety disorder: Secondary | ICD-10-CM | POA: Diagnosis not present

## 2020-12-15 DIAGNOSIS — E7849 Other hyperlipidemia: Secondary | ICD-10-CM | POA: Insufficient documentation

## 2020-12-15 DIAGNOSIS — R7989 Other specified abnormal findings of blood chemistry: Secondary | ICD-10-CM

## 2020-12-15 MED ORDER — BUSPIRONE HCL 10 MG PO TABS: 10.0000 mg | ORAL_TABLET | Freq: Two times a day (BID) | ORAL | 0 refills | Status: DC

## 2020-12-15 MED ORDER — VITAMIN D (ERGOCALCIFEROL) 1.25 MG (50000 UNIT) PO CAPS: 50000.0000 [IU] | ORAL_CAPSULE | ORAL | 0 refills | Status: DC

## 2020-12-16 LAB — LIPID PANEL
Chol/HDL Ratio: 5.3 ratio — ABNORMAL HIGH (ref 0.0–4.4)
Cholesterol, Total: 250 mg/dL — ABNORMAL HIGH (ref 100–199)
HDL: 47 mg/dL (ref >39)
LDL Chol Calc (NIH): 180 mg/dL — ABNORMAL HIGH (ref 0–99)
Triglycerides: 125 mg/dL (ref 0–149)
VLDL Cholesterol Cal: 23 mg/dL (ref 5–40)

## 2020-12-16 LAB — COMPREHENSIVE METABOLIC PANEL
ALT: 33 IU/L — ABNORMAL HIGH (ref 0–32)
AST: 28 IU/L (ref 0–40)
Albumin/Globulin Ratio: 1.8 (ref 1.2–2.2)
Albumin: 4.6 g/dL (ref 3.8–4.9)
Alkaline Phosphatase: 51 IU/L (ref 44–121)
BUN/Creatinine Ratio: 13 (ref 9–23)
BUN: 10 mg/dL (ref 6–24)
Bilirubin Total: 0.7 mg/dL (ref 0.0–1.2)
CO2: 20 mmol/L (ref 20–29)
Calcium: 9.7 mg/dL (ref 8.7–10.2)
Chloride: 101 mmol/L (ref 96–106)
Creatinine, Ser: 0.75 mg/dL (ref 0.57–1.00)
Globulin, Total: 2.6 g/dL (ref 1.5–4.5)
Glucose: 88 mg/dL (ref 65–99)
Potassium: 4.1 mmol/L (ref 3.5–5.2)
Sodium: 139 mmol/L (ref 134–144)
Total Protein: 7.2 g/dL (ref 6.0–8.5)
eGFR: 96 mL/min/{1.73_m2} (ref >59)

## 2020-12-16 LAB — CBC
Hematocrit: 43.8 % (ref 34.0–46.6)
Hemoglobin: 14.8 g/dL (ref 11.1–15.9)
MCH: 28.1 pg (ref 26.6–33.0)
MCHC: 33.8 g/dL (ref 31.5–35.7)
MCV: 83 fL (ref 79–97)
Platelets: 193 10*3/uL (ref 150–450)
RBC: 5.27 x10E6/uL (ref 3.77–5.28)
RDW: 12.7 % (ref 11.7–15.4)
WBC: 7.1 10*3/uL (ref 3.4–10.8)

## 2020-12-16 LAB — VITAMIN D 25 HYDROXY (VIT D DEFICIENCY, FRACTURES): Vit D, 25-Hydroxy: 46.6 ng/mL (ref 30.0–100.0)

## 2020-12-16 LAB — HEMOGLOBIN A1C
Est. average glucose Bld gHb Est-mCnc: 126 mg/dL
Hgb A1c MFr Bld: 6 % — ABNORMAL HIGH (ref 4.8–5.6)

## 2020-12-16 LAB — FERRITIN: Ferritin: 480 ng/mL — ABNORMAL HIGH (ref 15–150)

## 2020-12-16 LAB — INSULIN, RANDOM: INSULIN: 8.6 u[IU]/mL (ref 2.6–24.9)

## 2020-12-17 ENCOUNTER — Ambulatory Visit (INDEPENDENT_AMBULATORY_CARE_PROVIDER_SITE_OTHER): Payer: Commercial Managed Care - PPO | Admitting: Psychology

## 2020-12-17 DIAGNOSIS — F4321 Adjustment disorder with depressed mood: Secondary | ICD-10-CM

## 2020-12-17 NOTE — Telephone Encounter (Signed)
Please review

## 2020-12-17 NOTE — Progress Notes (Signed)
Chief Complaint:   OBESITY [name] is here to discuss her progress with her obesity treatment plan along with follow-up of her obesity related diagnoses. [name] is on keeping a food journal and adhering to recommended goals of 1000 calories and 80 g protein and states she is following her eating plan approximately 65% of the time. [name] states she is not currently exercising.  Today's visit was #: 18 Starting weight: 185 lbs Starting date: 08/21/2019 Today's weight: 179 lbs Today's date: 12/15/2020 Total lbs lost to date: 6 Total lbs lost since last in-office visit: 0  Interim History: [name] often consumes 1200-1300 cal/day. She estimates to track intake 2 times a week. She has eliminated daytime drinking of wine. She estimates to enjoy two 8-9 oz of wine per evening. She is up 1 lb since her last OV.  01/13/2020 CMP- ALT 36 (slightly elevated).  Subjective:   1. Polyphagia PDMD reviewed- no aberrancies noted- refilling phentermine 37.5 mg every 30 days.  [name] is up 1 lb since her last OV.  EKG 09/20/2020- NSR with cardiology.  2. Vitamin D deficiency [name]'s Vitamin D level was 42.3 on 7/14/201- below goal of 50. She is currently taking prescription vitamin D 50,000 IU each week. She denies nausea, vomiting or muscle weakness.  3. Essential hypertension BP/HR stable. Cardiology manages BP. [name] is on labetalol 300 mg BID. She denies excessive fatigue.  BP Readings from Last 3 Encounters:  12/15/20 134/84  10/21/20 124/90  09/22/20 126/86   4. GAD (generalized anxiety disorder) BP/HR stable at OV. [name] reports intermittent anxiety, previously using propranolol 20 mg TID/PRN. She denies excessive fatigue. HR stable today- 77 BPM.  5. Other hyperlipidemia Family history of HLD- both parents. 01/13/2020 lipid panel- total/LDL were both well above goal. [name] is not on statin therapy.  6. Elevated hemoglobin (HCC) 01/23/2020 CBC- H/H 16.8/49.2. [name] denies tobacco use.  7.  Elevated ferritin level Elevated ferritin at last 2 checks.   8. Pre-diabetes [name] is not on any BG lowering medications.  9. At risk for heart disease [name] is at a higher than average risk for cardiovascular disease due to obesity.   Assessment/Plan:   1. Polyphagia Intensive lifestyle modifications are the first line treatment for this issue. We discussed several lifestyle modifications today and she will continue to work on diet, exercise and weight loss efforts. Orders and follow up as documented in patient record. -Increase protein intake.  Counseling . Polyphagia is excessive hunger. . Causes can include: low blood sugars, hypERthyroidism, PMS, lack of sleep, stress, insulin resistance, diabetes, certain medications, and diets that are deficient in protein and fiber.   2. Vitamin D deficiency Low Vitamin D level contributes to fatigue and are associated with obesity, breast, and colon cancer. She agrees to continue to take prescription Vitamin D @50 ,000 IU every week and will follow-up for routine testing of Vitamin D, at least 2-3 times per year to avoid over-replacement.  - Vitamin D, Ergocalciferol, (DRISDOL) 1.25 MG (50000 UNIT) CAPS capsule; Take 1 capsule (50,000 Units total) by mouth every 7 (seven) days.  Dispense: 12 capsule; Refill: 0  - VITAMIN D 25 Hydroxy (Vit-D Deficiency, Fractures)  3. Essential hypertension [name] is working on healthy weight loss and exercise to improve blood pressure control. We will watch for signs of hypotension as she continues her lifestyle modifications. -Continue with cardiology as directed.  - Comprehensive metabolic panel  4. GAD (generalized anxiety disorder) Behavior modification techniques were discussed today to help  [name] deal with her anxiety.  Orders and follow up as documented in patient record.  -Recommend starting bupropion 10 mg, as prescribed below.  - busPIRone (BUSPAR) 10 MG tablet; Take 1 tablet (10 mg total) by  mouth 2 (two) times daily.  Dispense: 60 tablet; Refill: 0  5. Other hyperlipidemia Cardiovascular risk and specific lipid/LDL goals reviewed.  We discussed several lifestyle modifications today and [name] will continue to work on diet, exercise and weight loss efforts. Orders and follow up as documented in patient record.   Counseling Intensive lifestyle modifications are the first line treatment for this issue. . Dietary changes: Increase soluble fiber. Decrease simple carbohydrates. . Exercise changes: Moderate to vigorous-intensity aerobic activity 150 minutes per week if tolerated. . Lipid-lowering medications: see documented in medical record.  - Lipid panel  6. Elevated hemoglobin (HCC) Check labs today.  - CBC  7. Elevated ferritin level Check labs today.  - Ferritin  8. Pre-diabetes [name] will continue to work on weight loss, exercise, and decreasing simple carbohydrates to help decrease the risk of diabetes.   - Hemoglobin A1c - Insulin, random  9. At risk for heart disease [name] was given approximately 30 minutes of coronary artery disease prevention counseling today. She is 50 y.o. female and has risk factors for heart disease including obesity. We discussed intensive lifestyle modifications today with an emphasis on specific weight loss instructions and strategies.   Repetitive spaced learning was employed today to elicit superior memory formation and behavioral change.  10. Class 1 obesity with serious comorbidity and body mass index (BMI) of 32.0 to 32.9 in adult, unspecified obesity type  [name] is currently in the action stage of change. As such, her goal is to continue with weight loss efforts. She has agreed to keeping a food journal and adhering to recommended goals of 1000 calories and 80 grams protein.   Exercise goals: Increase daily walking.  Behavioral modification strategies: increasing lean protein intake, decreasing simple carbohydrates, decreasing  liquid calories, meal planning and cooking strategies, keeping healthy foods in the home, planning for success and keeping a strict food journal.  [name] has agreed to follow-up with our clinic in 4 weeks. She was informed of the importance of frequent follow-up visits to maximize her success with intensive lifestyle modifications for her multiple health conditions.   [name] was informed we would discuss her lab results at her next visit unless there is a critical issue that needs to be addressed sooner. [name] agreed to keep her next visit at the agreed upon time to discuss these results.  Objective:   Blood pressure 134/84, pulse 77, temperature 97.8 F (36.6 C), height 5\' 2"  (1.575 m), weight 179 lb (81.2 kg), SpO2 97 %. Body mass index is 32.74 kg/m.  General: Cooperative, alert, well developed, in no acute distress. HEENT: Conjunctivae and lids unremarkable. Cardiovascular: Regular rhythm.  Lungs: Normal work of breathing. Neurologic: No focal deficits.   Lab Results  Component Value Date   CREATININE 0.75 12/15/2020   BUN 10 12/15/2020   NA 139 12/15/2020   K 4.1 12/15/2020   CL 101 12/15/2020   CO2 20 12/15/2020   Lab Results  Component Value Date   ALT 33 (H) 12/15/2020   AST 28 12/15/2020   ALKPHOS 51 12/15/2020   BILITOT 0.7 12/15/2020   Lab Results  Component Value Date   HGBA1C 6.0 (H) 12/15/2020   HGBA1C 6.0 (H) 01/23/2020   HGBA1C 6.0 (H) 08/21/2019   Lab Results  Component Value Date   INSULIN 8.6 12/15/2020   INSULIN 12.4 08/21/2019   Lab Results  Component Value Date   TSH 2.280 08/21/2019   Lab Results  Component Value Date   CHOL 250 (H) 12/15/2020   HDL 47 12/15/2020   LDLCALC 180 (H) 12/15/2020   TRIG 125 12/15/2020   CHOLHDL 5.3 (H) 12/15/2020   Lab Results  Component Value Date   WBC 7.1 12/15/2020   HGB 14.8 12/15/2020   HCT 43.8 12/15/2020   MCV 83 12/15/2020   PLT 193 12/15/2020   Lab Results  Component Value Date   IRON 91  01/23/2020   TIBC 368 01/23/2020   FERRITIN 480 (H) 12/15/2020     Attestation Statements:   Reviewed by clinician on day of visit: allergies, medications, problem list, medical history, surgical history, family history, social history, and previous encounter notes.  Edmund Hilda, CMA, am acting as transcriptionist for William Hamburger, NP.  I have reviewed the above documentation for accuracy and completeness, and I agree with the above. -  Lorely Bubb d. Trine Fread, NP-C

## 2020-12-18 ENCOUNTER — Other Ambulatory Visit (INDEPENDENT_AMBULATORY_CARE_PROVIDER_SITE_OTHER): Payer: Self-pay | Admitting: Family Medicine

## 2020-12-18 ENCOUNTER — Encounter (INDEPENDENT_AMBULATORY_CARE_PROVIDER_SITE_OTHER): Payer: Self-pay | Admitting: Family Medicine

## 2020-12-18 DIAGNOSIS — R632 Polyphagia: Secondary | ICD-10-CM

## 2020-12-18 MED ORDER — PHENTERMINE HCL 37.5 MG PO TABS: 37.5000 mg | ORAL_TABLET | Freq: Every day | ORAL | 0 refills | Status: DC

## 2020-12-19 ENCOUNTER — Other Ambulatory Visit (INDEPENDENT_AMBULATORY_CARE_PROVIDER_SITE_OTHER): Payer: Self-pay | Admitting: Family Medicine

## 2020-12-19 DIAGNOSIS — R632 Polyphagia: Secondary | ICD-10-CM

## 2020-12-22 MED ORDER — PHENTERMINE HCL 37.5 MG PO TABS: 37.5000 mg | ORAL_TABLET | Freq: Every day | ORAL | 0 refills | Status: DC

## 2020-12-22 NOTE — Telephone Encounter (Signed)
Completed.

## 2021-01-14 ENCOUNTER — Ambulatory Visit (INDEPENDENT_AMBULATORY_CARE_PROVIDER_SITE_OTHER): Payer: Commercial Managed Care - PPO | Admitting: Family Medicine

## 2021-01-18 ENCOUNTER — Other Ambulatory Visit (INDEPENDENT_AMBULATORY_CARE_PROVIDER_SITE_OTHER): Payer: Self-pay | Admitting: Adult Health

## 2021-01-18 DIAGNOSIS — F411 Generalized anxiety disorder: Secondary | ICD-10-CM

## 2021-01-19 ENCOUNTER — Encounter (INDEPENDENT_AMBULATORY_CARE_PROVIDER_SITE_OTHER): Payer: Self-pay | Admitting: Family Medicine

## 2021-01-19 ENCOUNTER — Ambulatory Visit (INDEPENDENT_AMBULATORY_CARE_PROVIDER_SITE_OTHER): Payer: Commercial Managed Care - PPO | Admitting: Family Medicine

## 2021-01-19 ENCOUNTER — Other Ambulatory Visit (INDEPENDENT_AMBULATORY_CARE_PROVIDER_SITE_OTHER): Payer: Self-pay | Admitting: Adult Health

## 2021-01-19 ENCOUNTER — Other Ambulatory Visit: Payer: Self-pay

## 2021-01-19 VITALS — BP 119/81 | HR 75 | Temp 97.9°F | Ht 62.0 in | Wt 177.0 lb

## 2021-01-19 DIAGNOSIS — E669 Obesity, unspecified: Secondary | ICD-10-CM

## 2021-01-19 DIAGNOSIS — F3289 Other specified depressive episodes: Secondary | ICD-10-CM

## 2021-01-19 DIAGNOSIS — F411 Generalized anxiety disorder: Secondary | ICD-10-CM | POA: Diagnosis not present

## 2021-01-19 DIAGNOSIS — Z9189 Other specified personal risk factors, not elsewhere classified: Secondary | ICD-10-CM

## 2021-01-19 DIAGNOSIS — Z6834 Body mass index (BMI) 34.0-34.9, adult: Secondary | ICD-10-CM

## 2021-01-19 DIAGNOSIS — R632 Polyphagia: Secondary | ICD-10-CM | POA: Diagnosis not present

## 2021-01-19 DIAGNOSIS — R7301 Impaired fasting glucose: Secondary | ICD-10-CM

## 2021-01-19 DIAGNOSIS — I10 Essential (primary) hypertension: Secondary | ICD-10-CM | POA: Diagnosis not present

## 2021-01-19 DIAGNOSIS — R7303 Prediabetes: Secondary | ICD-10-CM

## 2021-01-19 NOTE — Telephone Encounter (Signed)
Pt last seen by Dr. Wallace.  

## 2021-01-19 NOTE — Progress Notes (Signed)
Chief Complaint:   OBESITY [name] is here to discuss her progress with her obesity treatment plan along with follow-up of her obesity related diagnoses. See Medical Weight Management Flowsheet for complete bioelectrical impedance results.  Today's visit was #: 19 Starting weight: 185 lbs Starting date: 08/21/2019 Today's weight: 177 lbs Today's date: 01/19/2021 Weight change since last visit: 2 lbs Total lbs lost to date: 8 lbs Body mass index is 32.37 kg/m.  Total weight loss percentage to date: -4.32%  Interim History:  See MyChart message from 01/20/2021.  [name] will be started on propranolol XL 80 mg daily and labetalol was stopped.  Nutrition Plan: keeping a food journal and adhering to recommended goals of 1000 calories and 80 grams of protein for 60% of the time. Activity:  Increased activity/walking. Anti-obesity medications: phentermine 37.5 mg daily. Reported side effects: None.  Assessment/Plan:   1. Impaired fasting glucose Start Ozempic 0.25 mg subcutaneously weekly, as per below.  - Start semaglutide,0.25 or 0.5MG /DOS, (OZEMPIC, 0.25 OR 0.5 MG/DOSE,) 2 MG/1.5ML SOPN; Inject 0.25 mg into the skin once a week.  Dispense: 0.75 mL; Refill: 0  2. Polyphagia Not at goal. Current treatment: phentermine 37.5 mg daily. Polyphagia refers to excessive feelings of hunger. She will continue to focus on protein-rich, low simple carbohydrate foods. We reviewed the importance of hydration, regular exercise for stress reduction, and restorative sleep.  3. Essential hypertension At goal. Medications: propranolol XL 80 mg daily, spironolactone 50 mg daily.   Plan: Avoid buying foods that are: processed, frozen, or prepackaged to avoid excess salt. We will watch for signs of hypotension as she continues lifestyle modifications.  BP Readings from Last 3 Encounters:  01/19/21 119/81  12/15/20 134/84  10/21/20 124/90   Lab Results  Component Value Date   CREATININE 0.75  12/15/2020   4. GAD (generalized anxiety disorder) [name] is taking Wellbutrin XL 150 mg daily.  Behavior modification techniques were discussed today to help [name] deal with her anxiety.  Orders and follow up as documented in patient record.   5. At risk for heart disease Due to [name]'s current state of health and medical condition(s), she is at a higher risk for heart disease.  This puts the patient at much greater risk to subsequently develop cardiopulmonary conditions that can significantly affect patient's quality of life in a negative manner.    At least 8 minutes were spent on counseling [name] about these concerns today. Evidence-based interventions for health behavior change were utilized today including the discussion of self monitoring techniques, problem-solving barriers, and SMART goal setting techniques.  Specifically, regarding patient's less desirable eating habits and patterns, we employed the technique of small changes when [name] has not been able to fully commit to her prudent nutritional plan.  6. Obesity, current BMI 32.4  Course: [name] is currently in the action stage of change. As such, her goal is to continue with weight loss efforts.   Nutrition goals: She has agreed to keeping a food journal and adhering to recommended goals of 1000 calories and 80 grams of protein.   Exercise goals:  As is.  Behavioral modification strategies: increasing lean protein intake, decreasing simple carbohydrates, increasing vegetables, increasing water intake, decreasing liquid calories, decreasing alcohol intake, decreasing sodium intake, and increasing high fiber foods.  [name] has agreed to follow-up with our clinic in 4 weeks. She was informed of the importance of frequent follow-up visits to maximize her success with intensive lifestyle modifications for her multiple health conditions.  Objective:   Blood pressure 119/81, pulse 75, temperature 97.9 F (36.6 C), temperature source  Oral, height 5\' 2"  (1.575 m), weight 177 lb (80.3 kg), SpO2 98 %. Body mass index is 32.37 kg/m.  General: Cooperative, alert, well developed, in no acute distress. HEENT: Conjunctivae and lids unremarkable. Cardiovascular: Regular rhythm.  Lungs: Normal work of breathing. Neurologic: No focal deficits.   Lab Results  Component Value Date   CREATININE 0.75 12/15/2020   BUN 10 12/15/2020   NA 139 12/15/2020   K 4.1 12/15/2020   CL 101 12/15/2020   CO2 20 12/15/2020   Lab Results  Component Value Date   ALT 33 (H) 12/15/2020   AST 28 12/15/2020   ALKPHOS 51 12/15/2020   BILITOT 0.7 12/15/2020   Lab Results  Component Value Date   HGBA1C 6.0 (H) 12/15/2020   HGBA1C 6.0 (H) 01/23/2020   HGBA1C 6.0 (H) 08/21/2019   Lab Results  Component Value Date   INSULIN 8.6 12/15/2020   INSULIN 12.4 08/21/2019   Lab Results  Component Value Date   TSH 2.280 08/21/2019   Lab Results  Component Value Date   CHOL 250 (H) 12/15/2020   HDL 47 12/15/2020   LDLCALC 180 (H) 12/15/2020   TRIG 125 12/15/2020   CHOLHDL 5.3 (H) 12/15/2020   Lab Results  Component Value Date   VD25OH 46.6 12/15/2020   VD25OH 42.3 01/23/2020   VD25OH 17.7 (L) 08/21/2019   Lab Results  Component Value Date   WBC 7.1 12/15/2020   HGB 14.8 12/15/2020   HCT 43.8 12/15/2020   MCV 83 12/15/2020   PLT 193 12/15/2020   Lab Results  Component Value Date   IRON 91 01/23/2020   TIBC 368 01/23/2020   FERRITIN 480 (H) 12/15/2020   Attestation Statements:   Reviewed by clinician on day of visit: allergies, medications, problem list, medical history, surgical history, family history, social history, and previous encounter notes.  I, 02/14/2021, CMA, am acting as transcriptionist for Insurance claims handler, DO  I have reviewed the above documentation for accuracy and completeness, and I agree with the above. Helane Rima, DO

## 2021-01-20 ENCOUNTER — Encounter (INDEPENDENT_AMBULATORY_CARE_PROVIDER_SITE_OTHER): Payer: Self-pay | Admitting: Family Medicine

## 2021-01-20 MED ORDER — OZEMPIC (0.25 OR 0.5 MG/DOSE) 2 MG/1.5ML ~~LOC~~ SOPN: 0.2500 mg | PEN_INJECTOR | SUBCUTANEOUS | 0 refills | Status: DC

## 2021-01-20 NOTE — Telephone Encounter (Signed)
Last seen by Dr. Wallace. 

## 2021-01-22 MED ORDER — PROPRANOLOL HCL ER BEADS 80 MG PO CP24: 80.0000 mg | ORAL_CAPSULE | Freq: Every day | ORAL | 0 refills | Status: DC

## 2021-01-28 ENCOUNTER — Ambulatory Visit (INDEPENDENT_AMBULATORY_CARE_PROVIDER_SITE_OTHER): Payer: Commercial Managed Care - PPO | Admitting: Psychology

## 2021-01-28 DIAGNOSIS — F4321 Adjustment disorder with depressed mood: Secondary | ICD-10-CM

## 2021-02-04 ENCOUNTER — Telehealth: Payer: Self-pay | Admitting: Cardiovascular Disease

## 2021-02-04 MED ORDER — SPIRONOLACTONE 50 MG PO TABS: 50.0000 mg | ORAL_TABLET | Freq: Every day | ORAL | 3 refills | Status: DC

## 2021-02-04 NOTE — Telephone Encounter (Signed)
*  STAT* If patient is at the pharmacy, call can be transferred to refill team.   1. Which medications need to be refilled? (please list name of each medication and dose if known)  spironolactone (ALDACTONE) 50 MG tablet  2. Which pharmacy/location (including street and city if local pharmacy) is medication to be sent to? [facility]  [facility]) - [place], [place] - [address]  3. Do they need a 30 day or 90 day supply? 90 day supply

## 2021-02-11 ENCOUNTER — Other Ambulatory Visit (INDEPENDENT_AMBULATORY_CARE_PROVIDER_SITE_OTHER): Payer: Self-pay | Admitting: Family Medicine

## 2021-02-11 DIAGNOSIS — R7301 Impaired fasting glucose: Secondary | ICD-10-CM

## 2021-02-13 ENCOUNTER — Other Ambulatory Visit (INDEPENDENT_AMBULATORY_CARE_PROVIDER_SITE_OTHER): Payer: Self-pay | Admitting: Family Medicine

## 2021-02-16 ENCOUNTER — Ambulatory Visit (INDEPENDENT_AMBULATORY_CARE_PROVIDER_SITE_OTHER): Payer: Commercial Managed Care - PPO | Admitting: Family Medicine

## 2021-02-18 ENCOUNTER — Other Ambulatory Visit: Payer: Self-pay

## 2021-02-18 ENCOUNTER — Encounter (INDEPENDENT_AMBULATORY_CARE_PROVIDER_SITE_OTHER): Payer: Self-pay | Admitting: Family Medicine

## 2021-02-18 ENCOUNTER — Ambulatory Visit (INDEPENDENT_AMBULATORY_CARE_PROVIDER_SITE_OTHER): Payer: Commercial Managed Care - PPO | Admitting: Family Medicine

## 2021-02-18 VITALS — BP 129/80 | HR 78 | Temp 98.5°F | Ht 62.0 in | Wt 176.0 lb

## 2021-02-18 DIAGNOSIS — Z6834 Body mass index (BMI) 34.0-34.9, adult: Secondary | ICD-10-CM

## 2021-02-18 DIAGNOSIS — I10 Essential (primary) hypertension: Secondary | ICD-10-CM

## 2021-02-18 DIAGNOSIS — E669 Obesity, unspecified: Secondary | ICD-10-CM

## 2021-02-18 DIAGNOSIS — F411 Generalized anxiety disorder: Secondary | ICD-10-CM

## 2021-02-18 DIAGNOSIS — E559 Vitamin D deficiency, unspecified: Secondary | ICD-10-CM | POA: Diagnosis not present

## 2021-02-18 DIAGNOSIS — Z9189 Other specified personal risk factors, not elsewhere classified: Secondary | ICD-10-CM | POA: Diagnosis not present

## 2021-02-18 MED ORDER — PROPRANOLOL HCL ER BEADS 80 MG PO CP24: 80.0000 mg | ORAL_CAPSULE | Freq: Every day | ORAL | 0 refills | Status: DC

## 2021-02-18 MED ORDER — VITAMIN D (ERGOCALCIFEROL) 1.25 MG (50000 UNIT) PO CAPS: 50000.0000 [IU] | ORAL_CAPSULE | ORAL | 0 refills | Status: DC

## 2021-02-24 NOTE — Progress Notes (Signed)
Chief Complaint:   OBESITY [name] is here to discuss her progress with her obesity treatment plan along with follow-up of her obesity related diagnoses.   Today's visit was #: 20 Starting weight: 185 lbs Starting date: 08/21/2019 Today's weight: 176 lbs Today's date: 02/18/2021 Weight change since last visit: -1 lb Total lbs lost to date: 9 lbs Body mass index is 32.19 kg/m.  Total weight loss percentage to date: -4.86  Current Meal Plan: keeping a food journal and adhering to recommended goals of 1200 calories and 80 protein for 70% of the time.  Current Exercise Plan: Walking for 30 minutes 3 times per week. Current Anti-Obesity Medications: Ozempic 0.25 mg subcutaneously weekly. Side effects: None.  Interim History: Kerrin took her Ozempic for 2 weeks then forgot to take it for 2 weeks, then took Phentermine. She started taking a B Complex. Her blood pressure and heart rate are at goal. On Propranolol, she denies tremors. She is eating more yogurt, tangelos, and peanuts.  Assessment/Plan:   1. Vitamin D deficiency Improving, but not optimized.   Plan: Continue to take prescription Vitamin D @50 ,000 IU every week as prescribed.  Follow-up for routine testing of Vitamin D, at least 2-3 times per year to avoid over-replacement.  Lab Results  Component Value Date   VD25OH 46.6 12/15/2020   VD25OH 42.3 01/23/2020   VD25OH 17.7 (L) 08/21/2019   - Refill Vitamin D, Ergocalciferol, (DRISDOL) 1.25 MG (50000 UNIT) CAPS capsule; Take 1 capsule (50,000 Units total) by mouth every 7 (seven) days.  Dispense: 12 capsule; Refill: 0  2. Essential hypertension At goal. Medications: propranolol XL 80 mg daily, spironolactone 50 mg daily.   Plan: Avoid buying foods that are: processed, frozen, or prepackaged to avoid excess salt. We will watch for signs of hypotension as she continues lifestyle modifications. We will continue to monitor closely alongside her PCP and/or Specialist.     BP Readings from Last 3 Encounters:  02/18/21 129/80  01/19/21 119/81  12/15/20 134/84   Lab Results  Component Value Date   CREATININE 0.75 12/15/2020   3. GAD (generalized anxiety disorder) Tilley is taking Wellbutrin XL 150 mg daily.   Behavior modification techniques were discussed today to help Brittinee deal with her anxiety.   4. At risk for heart disease Due to Idil's current state of health and medical condition(s), she is at a higher risk for heart disease.   This puts the patient at much greater risk to subsequently develop cardiopulmonary conditions that can significantly affect patient's quality of life in a negative manner as well.    At least 9 minutes was spent on counseling Tkeyah about these concerns today. Initial goal is to lose at least 5-10% of starting weight to help reduce these risk factors.  We will continue to reassess these conditions on a fairly regular basis in an attempt to decrease patient's overall morbidity and mortality.  Evidence-based interventions for health behavior change were utilized today including the discussion of self monitoring techniques, problem-solving barriers and SMART goal setting techniques.  Specifically regarding patient's less desirable eating habits and patterns, we employed the technique of small changes when Alaja has not been able to fully commit to her prudent nutritional plan.  5. Obesity, current BMI 32.3 Course: Pinkey is currently in the action stage of change. As such, her goal is to continue with weight loss efforts.   Nutrition goals: She has agreed to keeping a food journal and adhering to recommended goals  of 1000 calories and 80 grams of protein.   Exercise goals: As is.  Behavioral modification strategies: increasing lean protein intake, decreasing simple carbohydrates, and increasing vegetables.  Camryn has agreed to follow-up with our clinic in 4 weeks. She was informed of the importance of frequent follow-up visits  to maximize her success with intensive lifestyle modifications for her multiple health conditions.   Objective:   Blood pressure 129/80, pulse 78, temperature 98.5 F (36.9 C), height 5\' 2"  (1.575 m), weight 176 lb (79.8 kg), SpO2 97 %. Body mass index is 32.19 kg/m.  General: Cooperative, alert, well developed, in no acute distress. HEENT: Conjunctivae and lids unremarkable. Cardiovascular: Regular rhythm.  Lungs: Normal work of breathing. Neurologic: No focal deficits.   Lab Results  Component Value Date   CREATININE 0.75 12/15/2020   BUN 10 12/15/2020   NA 139 12/15/2020   K 4.1 12/15/2020   CL 101 12/15/2020   CO2 20 12/15/2020   Lab Results  Component Value Date   ALT 33 (H) 12/15/2020   AST 28 12/15/2020   ALKPHOS 51 12/15/2020   BILITOT 0.7 12/15/2020   Lab Results  Component Value Date   HGBA1C 6.0 (H) 12/15/2020   HGBA1C 6.0 (H) 01/23/2020   HGBA1C 6.0 (H) 08/21/2019   Lab Results  Component Value Date   INSULIN 8.6 12/15/2020   INSULIN 12.4 08/21/2019   Lab Results  Component Value Date   TSH 2.280 08/21/2019   Lab Results  Component Value Date   CHOL 250 (H) 12/15/2020   HDL 47 12/15/2020   LDLCALC 180 (H) 12/15/2020   TRIG 125 12/15/2020   CHOLHDL 5.3 (H) 12/15/2020   Lab Results  Component Value Date   VD25OH 46.6 12/15/2020   VD25OH 42.3 01/23/2020   VD25OH 17.7 (L) 08/21/2019   Lab Results  Component Value Date   WBC 7.1 12/15/2020   HGB 14.8 12/15/2020   HCT 43.8 12/15/2020   MCV 83 12/15/2020   PLT 193 12/15/2020   Lab Results  Component Value Date   IRON 91 01/23/2020   TIBC 368 01/23/2020   FERRITIN 480 (H) 12/15/2020   Attestation Statements:   Reviewed by clinician on day of visit: allergies, medications, problem list, medical history, surgical history, family history, social history, and previous encounter notes.  02/14/2021 Friedenbach, CMA, am acting as 12-16-1974 for Energy manager, DO.  I have reviewed the  above documentation for accuracy and completeness, and I agree with the above. [employer], DO

## 2021-02-25 ENCOUNTER — Ambulatory Visit: Payer: Commercial Managed Care - PPO | Admitting: Psychology

## 2021-03-03 ENCOUNTER — Other Ambulatory Visit (INDEPENDENT_AMBULATORY_CARE_PROVIDER_SITE_OTHER): Payer: Self-pay | Admitting: Family Medicine

## 2021-03-03 MED ORDER — PROPRANOLOL HCL ER BEADS 80 MG PO CP24: 80.0000 mg | ORAL_CAPSULE | Freq: Every day | ORAL | 0 refills | Status: DC

## 2021-03-04 ENCOUNTER — Other Ambulatory Visit (INDEPENDENT_AMBULATORY_CARE_PROVIDER_SITE_OTHER): Payer: Self-pay | Admitting: Family Medicine

## 2021-03-04 NOTE — Telephone Encounter (Signed)
Dr.Wallace °

## 2021-03-25 ENCOUNTER — Ambulatory Visit: Payer: Commercial Managed Care - PPO | Admitting: Psychology

## 2021-03-30 ENCOUNTER — Other Ambulatory Visit (INDEPENDENT_AMBULATORY_CARE_PROVIDER_SITE_OTHER): Payer: Self-pay | Admitting: Family Medicine

## 2021-03-30 ENCOUNTER — Ambulatory Visit (INDEPENDENT_AMBULATORY_CARE_PROVIDER_SITE_OTHER): Payer: Commercial Managed Care - PPO | Admitting: Family Medicine

## 2021-04-01 ENCOUNTER — Ambulatory Visit (INDEPENDENT_AMBULATORY_CARE_PROVIDER_SITE_OTHER): Payer: Commercial Managed Care - PPO | Admitting: Family Medicine

## 2021-04-01 ENCOUNTER — Other Ambulatory Visit: Payer: Self-pay

## 2021-04-01 ENCOUNTER — Encounter (INDEPENDENT_AMBULATORY_CARE_PROVIDER_SITE_OTHER): Payer: Self-pay | Admitting: Family Medicine

## 2021-04-01 VITALS — BP 119/83 | HR 78 | Temp 98.0°F | Ht 62.0 in | Wt 179.0 lb

## 2021-04-01 DIAGNOSIS — Z9189 Other specified personal risk factors, not elsewhere classified: Secondary | ICD-10-CM | POA: Diagnosis not present

## 2021-04-01 DIAGNOSIS — R632 Polyphagia: Secondary | ICD-10-CM

## 2021-04-01 DIAGNOSIS — F411 Generalized anxiety disorder: Secondary | ICD-10-CM | POA: Diagnosis not present

## 2021-04-01 DIAGNOSIS — E669 Obesity, unspecified: Secondary | ICD-10-CM

## 2021-04-01 DIAGNOSIS — Z6834 Body mass index (BMI) 34.0-34.9, adult: Secondary | ICD-10-CM

## 2021-04-01 DIAGNOSIS — I10 Essential (primary) hypertension: Secondary | ICD-10-CM

## 2021-04-01 DIAGNOSIS — R7301 Impaired fasting glucose: Secondary | ICD-10-CM | POA: Diagnosis not present

## 2021-04-01 MED ORDER — PROPRANOLOL HCL ER 80 MG PO CP24: 80.0000 mg | ORAL_CAPSULE | Freq: Every day | ORAL | 0 refills | Status: DC

## 2021-04-01 MED ORDER — INSULIN PEN NEEDLE 32G X 4 MM MISC: 1.0000 | 0 refills | Status: AC

## 2021-04-01 MED ORDER — OZEMPIC (0.25 OR 0.5 MG/DOSE) 2 MG/1.5ML ~~LOC~~ SOPN: 0.5000 mg | PEN_INJECTOR | SUBCUTANEOUS | 0 refills | Status: DC

## 2021-04-02 NOTE — Progress Notes (Signed)
Chief Complaint:   OBESITY [name] is here to discuss her progress with her obesity treatment plan along with follow-up of her obesity related diagnoses.   Today's visit was #: 21 Starting weight: 185 lbs Starting date: 08/21/2019 Today's weight: 179 lbs Today's date: 04/01/2021 Weight change since last visit: +3 lbs Total lbs lost to date: 6 lbs Body mass index is 32.74 kg/m.  Total weight loss percentage to date: -3.24%  Current Meal Plan: keeping a food journal and adhering to recommended goals of 1000 calories and 80 grams of protein for 65% of the time.  Current Exercise Plan: Walking 1 mile 3 times per week. Current Anti-Obesity Medications: Ozempic 0.25 mg subcutaneously weekly. Side effects: None.  Interim History:  [name] says she has had increased work stress.  She endorses some night eating and drinking wine.    For her next visit, she will focus on sleep.  Assessment/Plan:   1. Polyphagia Not at goal. Current treatment: Mounjaro 0.25 mg subcutaneously weekly. She will continue to focus on protein-rich, low simple carbohydrate foods. We reviewed the importance of hydration, regular exercise for stress reduction, and restorative sleep.  2. Impaired fasting glucose Increase Ozempic to 0.5 mg subcutaneously weekly, as per below.  - Increase and refill Semaglutide,0.25 or 0.5MG /DOS, (OZEMPIC, 0.25 OR 0.5 MG/DOSE,) 2 MG/1.5ML SOPN; Inject 0.5 mg into the skin once a week.  Dispense: 1.5 mL; Refill: 0 - Refill Insulin Pen Needle 32G X 4 MM MISC; 1 each by Does not apply route once a week.  Dispense: 10 each; Refill: 0  3. Essential hypertension At goal. Medications: propranolol XL 80 mg daily, spironolactone 50 mg daily. Plan: Avoid buying foods that are: processed, frozen, or prepackaged to avoid excess salt. We will watch for signs of hypotension as she continues lifestyle modifications.  BP Readings from Last 3 Encounters:  04/01/21 119/83  02/18/21 129/80  01/19/21  119/81   Lab Results  Component Value Date   CREATININE 0.75 12/15/2020   - Refill propranolol ER (INDERAL LA) 80 MG 24 hr capsule; Take 1 capsule (80 mg total) by mouth daily.  Dispense: 90 capsule; Refill: 0  4. GAD (generalized anxiety disorder) [name] is taking Wellbutrin XL 150 mg daily.   Behavior modification techniques were discussed today to help [name] deal with her anxiety.  5. At risk for heart disease Due to [name]'s current state of health and medical condition(s), she is at a higher risk for heart disease.  This puts the patient at much greater risk to subsequently develop cardiopulmonary conditions that can significantly affect patient's quality of life in a negative manner.    At least 8 minutes were spent on counseling [name] about these concerns today. Evidence-based interventions for health behavior change were utilized today including the discussion of self monitoring techniques, problem-solving barriers, and SMART goal setting techniques.  Specifically, regarding patient's less desirable eating habits and patterns, we employed the technique of small changes when [name] has not been able to fully commit to her prudent nutritional plan.  6. Obesity, current BMI 32.8  Course: [name] is currently in the action stage of change. As such, her goal is to continue with weight loss efforts.   Nutrition goals: She has agreed to keeping a food journal and adhering to recommended goals of 1000 calories and 80 grams of protein.   Exercise goals:  As is.  Behavioral modification strategies: increasing lean protein intake, decreasing simple carbohydrates, increasing vegetables, increasing water intake, decreasing liquid  calories, decreasing alcohol intake, decreasing sodium intake, and increasing high fiber foods.  [name] has agreed to follow-up with our clinic in 4 weeks. She was informed of the importance of frequent follow-up visits to maximize her success with intensive lifestyle  modifications for her multiple health conditions.   Objective:   Blood pressure 119/83, pulse 78, temperature 98 F (36.7 C), temperature source Oral, height 5\' 2"  (1.575 m), weight 179 lb (81.2 kg), SpO2 96 %. Body mass index is 32.74 kg/m.  General: Cooperative, alert, well developed, in no acute distress. HEENT: Conjunctivae and lids unremarkable. Cardiovascular: Regular rhythm.  Lungs: Normal work of breathing. Neurologic: No focal deficits.   Lab Results  Component Value Date   CREATININE 0.75 12/15/2020   BUN 10 12/15/2020   NA 139 12/15/2020   K 4.1 12/15/2020   CL 101 12/15/2020   CO2 20 12/15/2020   Lab Results  Component Value Date   ALT 33 (H) 12/15/2020   AST 28 12/15/2020   ALKPHOS 51 12/15/2020   BILITOT 0.7 12/15/2020   Lab Results  Component Value Date   HGBA1C 6.0 (H) 12/15/2020   HGBA1C 6.0 (H) 01/23/2020   HGBA1C 6.0 (H) 08/21/2019   Lab Results  Component Value Date   INSULIN 8.6 12/15/2020   INSULIN 12.4 08/21/2019   Lab Results  Component Value Date   TSH 2.280 08/21/2019   Lab Results  Component Value Date   CHOL 250 (H) 12/15/2020   HDL 47 12/15/2020   LDLCALC 180 (H) 12/15/2020   TRIG 125 12/15/2020   CHOLHDL 5.3 (H) 12/15/2020   Lab Results  Component Value Date   VD25OH 46.6 12/15/2020   VD25OH 42.3 01/23/2020   VD25OH 17.7 (L) 08/21/2019   Lab Results  Component Value Date   WBC 7.1 12/15/2020   HGB 14.8 12/15/2020   HCT 43.8 12/15/2020   MCV 83 12/15/2020   PLT 193 12/15/2020   Lab Results  Component Value Date   IRON 91 01/23/2020   TIBC 368 01/23/2020   FERRITIN 480 (H) 12/15/2020   Attestation Statements:   Reviewed by clinician on day of visit: allergies, medications, problem list, medical history, surgical history, family history, social history, and previous encounter notes.  I, 02/14/2021, CMA, am acting as transcriptionist for Insurance claims handler, DO  I have reviewed the above documentation for accuracy  and completeness, and I agree with the above. Helane Rima, DO

## 2021-04-13 ENCOUNTER — Other Ambulatory Visit (INDEPENDENT_AMBULATORY_CARE_PROVIDER_SITE_OTHER): Payer: Self-pay | Admitting: Family Medicine

## 2021-04-13 DIAGNOSIS — F3289 Other specified depressive episodes: Secondary | ICD-10-CM

## 2021-04-13 NOTE — Telephone Encounter (Signed)
Pt last seen by Dr. Wallace.  

## 2021-04-17 ENCOUNTER — Other Ambulatory Visit (INDEPENDENT_AMBULATORY_CARE_PROVIDER_SITE_OTHER): Payer: Self-pay | Admitting: Family Medicine

## 2021-04-17 DIAGNOSIS — E559 Vitamin D deficiency, unspecified: Secondary | ICD-10-CM

## 2021-04-18 ENCOUNTER — Other Ambulatory Visit (INDEPENDENT_AMBULATORY_CARE_PROVIDER_SITE_OTHER): Payer: Self-pay | Admitting: Family Medicine

## 2021-04-18 DIAGNOSIS — I10 Essential (primary) hypertension: Secondary | ICD-10-CM

## 2021-04-27 ENCOUNTER — Ambulatory Visit (INDEPENDENT_AMBULATORY_CARE_PROVIDER_SITE_OTHER): Payer: Commercial Managed Care - PPO | Admitting: Physician Assistant

## 2021-04-29 ENCOUNTER — Ambulatory Visit (INDEPENDENT_AMBULATORY_CARE_PROVIDER_SITE_OTHER): Payer: Commercial Managed Care - PPO | Admitting: Family Medicine

## 2021-05-16 ENCOUNTER — Other Ambulatory Visit (INDEPENDENT_AMBULATORY_CARE_PROVIDER_SITE_OTHER): Payer: Self-pay | Admitting: Family Medicine

## 2021-05-16 DIAGNOSIS — R7301 Impaired fasting glucose: Secondary | ICD-10-CM

## 2021-05-18 ENCOUNTER — Encounter (INDEPENDENT_AMBULATORY_CARE_PROVIDER_SITE_OTHER): Payer: Self-pay

## 2021-05-18 NOTE — Telephone Encounter (Signed)
Pt last seen by Dr. Wallace.  

## 2021-05-18 NOTE — Telephone Encounter (Signed)
Msg sent to pt 

## 2021-05-22 ENCOUNTER — Other Ambulatory Visit (INDEPENDENT_AMBULATORY_CARE_PROVIDER_SITE_OTHER): Payer: Self-pay | Admitting: Family Medicine

## 2021-05-22 DIAGNOSIS — R7301 Impaired fasting glucose: Secondary | ICD-10-CM

## 2021-05-25 NOTE — Telephone Encounter (Signed)
Last OV with Dr Wallace 

## 2021-05-25 NOTE — Telephone Encounter (Signed)
Pt last seen by Dr. Wallace.  

## 2021-05-27 ENCOUNTER — Ambulatory Visit (INDEPENDENT_AMBULATORY_CARE_PROVIDER_SITE_OTHER): Payer: Commercial Managed Care - PPO | Admitting: Family Medicine

## 2021-05-27 ENCOUNTER — Encounter (INDEPENDENT_AMBULATORY_CARE_PROVIDER_SITE_OTHER): Payer: Self-pay | Admitting: Family Medicine

## 2021-05-27 ENCOUNTER — Other Ambulatory Visit: Payer: Self-pay

## 2021-05-27 VITALS — BP 109/69 | HR 83 | Temp 97.9°F | Ht 62.0 in | Wt 181.0 lb

## 2021-05-27 DIAGNOSIS — F5081 Binge eating disorder: Secondary | ICD-10-CM | POA: Diagnosis not present

## 2021-05-27 DIAGNOSIS — E559 Vitamin D deficiency, unspecified: Secondary | ICD-10-CM

## 2021-05-27 DIAGNOSIS — R7301 Impaired fasting glucose: Secondary | ICD-10-CM

## 2021-05-27 DIAGNOSIS — Z6833 Body mass index (BMI) 33.0-33.9, adult: Secondary | ICD-10-CM

## 2021-05-27 DIAGNOSIS — E669 Obesity, unspecified: Secondary | ICD-10-CM

## 2021-05-27 DIAGNOSIS — I10 Essential (primary) hypertension: Secondary | ICD-10-CM

## 2021-05-27 DIAGNOSIS — F411 Generalized anxiety disorder: Secondary | ICD-10-CM

## 2021-05-27 MED ORDER — VITAMIN D (ERGOCALCIFEROL) 1.25 MG (50000 UNIT) PO CAPS: 50000.0000 [IU] | ORAL_CAPSULE | ORAL | 0 refills | Status: DC

## 2021-05-27 MED ORDER — VYVANSE 20 MG PO CHEW: 20.0000 mg | CHEWABLE_TABLET | Freq: Every morning | ORAL | 0 refills | Status: DC

## 2021-05-28 NOTE — Progress Notes (Signed)
Chief Complaint:   OBESITY [name] is here to discuss her progress with her obesity treatment plan along with follow-up of her obesity related diagnoses. See Medical Weight Management Flowsheet for complete bioelectrical impedance results.  Today's visit was #: 22 Starting weight: 185 lbs Starting date: 08/21/2019 Weight change since last visit: +2 lbs Total lbs lost to date: 4 lbs Total weight loss percentage to date: -2.16%  Nutrition Plan: Keeping a food journal and adhering to recommended goals of 1000 calories and 80 grams of protein daily for 50% of the time. Activity: Increased activity. Anti-obesity medications: Ozempic 0.5 mg subcutaneously weekly. Reported side effects: None.  Interim History: [name] reports that she is averaging 3-4 hours of sleep at night.  She is not able to do IF, and says she is snacking more.  She says that work is incredibly busy, not sustainable.  She says she was able to decrease her wine consumption to weekends for awhile, but she is back to drinking daily again.  Assessment/Plan:   1. Vitamin D deficiency Improving, but not optimized.  She is taking vitamin D 50,000 IU weekly   Plan: Continue to take prescription Vitamin D @50 ,000 IU every week as prescribed.  Follow-up for routine testing of Vitamin D, at least 2-3 times per year to avoid over-replacement.  Lab Results  Component Value Date   VD25OH 46.6 12/15/2020   VD25OH 42.3 01/23/2020   VD25OH 17.7 (L) 08/21/2019   - Refill Vitamin D, Ergocalciferol, (DRISDOL) 1.25 MG (50000 UNIT) CAPS capsule; Take 1 capsule (50,000 Units total) by mouth every 7 (seven) days.  Dispense: 12 capsule; Refill: 0  2. Impaired fasting glucose, with polyphagia Not at goal. Current treatment: Ozempic 0.5 mg subcutaneously weekly.  She is unable to eat much throughout the day on her current dose of Ozempic 0.5 mg.    Plan:  Decrease Ozempic to 0.25 mg subcutaneously weekly.  She will continue to focus on  protein-rich, low simple carbohydrate foods. We reviewed the importance of hydration, regular exercise for stress reduction, and restorative sleep.  3. Essential hypertension At goal. Medications: propranolol XL 80 mg daily, spironolactone 50 mg daily.   Plan: Avoid buying foods that are: processed, frozen, or prepackaged to avoid excess salt. We will watch for signs of hypotension as she continues lifestyle modifications.  BP Readings from Last 3 Encounters:  05/27/21 109/69  04/01/21 119/83  02/18/21 129/80   Lab Results  Component Value Date   CREATININE 0.75 12/15/2020   4. Binge eating disorder The goals for treatment of BED are to reduce eating binges and to achieve healthy eating habits. Because binge eating can correlate with negative emotions, treatment may also address any other mental-health issues, such as depression.  People who binge eat feel as if they don't have control over how much they eat and have feelings of guilt or self-loathing after a binge eating episode.  The FDA has approved Vyvanse as a treatment option for binge eating disorder. Vyvanse targets the brain's reward center by increasing the levels of dopamine and norepinephrine, the chemicals of the brain responsible for feelings of pleasure.  Mindful eating is the recommended nutritional approach to treating BED.   - Start Lisdexamfetamine Dimesylate (VYVANSE) 20 MG CHEW; Chew 20 mg by mouth every morning.  Dispense: 30 tablet; Refill: 0  I have consulted the [place] Controlled Substances Registry for this patient, and feel the risk/benefit ratio today is favorable for proceeding with this prescription for a controlled  substance. The patient understands monitoring parameters and red flags.   5. GAD (generalized anxiety disorder) [place] takes Wellbutrin XL 150 mg daily. Motivational interviewing as well as evidence-based interventions for health behavior change were utilized today including the discussion of self  monitoring techniques, problem-solving barriers and SMART goal setting techniques.  Specifically regarding patient's less desirable eating habits and patterns, we employed the technique of small changes when she cannot fully commit to her prudent nutritional plan. Provided psychoeducation and facilitated discussion regarding eating as habit, self-monitoring, stimulus control, regulating eating pattern, coping skills, and barriers to success.   Behavior modification techniques were discussed today to help [name] deal with her anxiety.     6. Class 1 obesity with serious comorbidity and body mass index (BMI) of 33.0 to 33.9 in adult, unspecified obesity type  Course: [name] is currently in the action stage of change. As such, her goal is to continue with weight loss efforts.   Nutrition goals: She has agreed to keeping a food journal and adhering to recommended goals of 1000 calories and 80 grams of protein.   Exercise goals:  Increased activities.  Behavioral modification strategies: increasing lean protein intake, decreasing simple carbohydrates, increasing vegetables, increasing water intake, decreasing liquid calories, decreasing alcohol intake, ways to avoid night time snacking, and emotional eating strategies.  [name] has agreed to follow-up with our clinic in 3 weeks. She was informed of the importance of frequent follow-up visits to maximize her success with intensive lifestyle modifications for her multiple health conditions.   Objective:   Blood pressure 109/69, pulse 83, temperature 97.9 F (36.6 C), temperature source Oral, height 5\' 2"  (1.575 m), weight 181 lb (82.1 kg), SpO2 95 %. Body mass index is 33.11 kg/m.  General: Cooperative, alert, well developed, in no acute distress. HEENT: Conjunctivae and lids unremarkable. Cardiovascular: Regular rhythm.  Lungs: Normal work of breathing. Neurologic: No focal deficits.   Lab Results  Component Value Date   CREATININE 0.75  12/15/2020   BUN 10 12/15/2020   NA 139 12/15/2020   K 4.1 12/15/2020   CL 101 12/15/2020   CO2 20 12/15/2020   Lab Results  Component Value Date   ALT 33 (H) 12/15/2020   AST 28 12/15/2020   ALKPHOS 51 12/15/2020   BILITOT 0.7 12/15/2020   Lab Results  Component Value Date   HGBA1C 6.0 (H) 12/15/2020   HGBA1C 6.0 (H) 01/23/2020   HGBA1C 6.0 (H) 08/21/2019   Lab Results  Component Value Date   INSULIN 8.6 12/15/2020   INSULIN 12.4 08/21/2019   Lab Results  Component Value Date   TSH 2.280 08/21/2019   Lab Results  Component Value Date   CHOL 250 (H) 12/15/2020   HDL 47 12/15/2020   LDLCALC 180 (H) 12/15/2020   TRIG 125 12/15/2020   CHOLHDL 5.3 (H) 12/15/2020   Lab Results  Component Value Date   VD25OH 46.6 12/15/2020   VD25OH 42.3 01/23/2020   VD25OH 17.7 (L) 08/21/2019   Lab Results  Component Value Date   WBC 7.1 12/15/2020   HGB 14.8 12/15/2020   HCT 43.8 12/15/2020   MCV 83 12/15/2020   PLT 193 12/15/2020   Lab Results  Component Value Date   IRON 91 01/23/2020   TIBC 368 01/23/2020   FERRITIN 480 (H) 12/15/2020   Attestation Statements:   Reviewed by clinician on day of visit: allergies, medications, problem list, medical history, surgical history, family history, social history, and previous encounter notes.  I, 02/14/2021  Agner, CMA, am acting as transcriptionist for Helane Rima, DO  I have reviewed the above documentation for accuracy and completeness, and I agree with the above. -  Helane Rima, DO, MS, FAAFP, DABOM - Family and Bariatric Medicine.

## 2021-06-02 ENCOUNTER — Other Ambulatory Visit (INDEPENDENT_AMBULATORY_CARE_PROVIDER_SITE_OTHER): Payer: Self-pay | Admitting: Family Medicine

## 2021-06-02 DIAGNOSIS — I10 Essential (primary) hypertension: Secondary | ICD-10-CM

## 2021-06-02 NOTE — Telephone Encounter (Signed)
Pt last seen by Dr. Wallace.  

## 2021-06-19 ENCOUNTER — Other Ambulatory Visit (INDEPENDENT_AMBULATORY_CARE_PROVIDER_SITE_OTHER): Payer: Self-pay | Admitting: Family Medicine

## 2021-06-19 DIAGNOSIS — R7301 Impaired fasting glucose: Secondary | ICD-10-CM

## 2021-06-22 ENCOUNTER — Encounter (INDEPENDENT_AMBULATORY_CARE_PROVIDER_SITE_OTHER): Payer: Self-pay

## 2021-06-22 NOTE — Telephone Encounter (Signed)
Pt last seen by Dr. Wallace.  

## 2021-06-22 NOTE — Telephone Encounter (Signed)
Msg sent to pt 

## 2021-06-30 ENCOUNTER — Encounter (INDEPENDENT_AMBULATORY_CARE_PROVIDER_SITE_OTHER): Payer: Self-pay | Admitting: Family Medicine

## 2021-06-30 ENCOUNTER — Other Ambulatory Visit: Payer: Self-pay

## 2021-06-30 ENCOUNTER — Ambulatory Visit (INDEPENDENT_AMBULATORY_CARE_PROVIDER_SITE_OTHER): Payer: Commercial Managed Care - PPO | Admitting: Family Medicine

## 2021-06-30 VITALS — BP 123/83 | HR 74 | Temp 97.5°F | Ht 62.0 in | Wt 182.0 lb

## 2021-06-30 DIAGNOSIS — R7301 Impaired fasting glucose: Secondary | ICD-10-CM

## 2021-06-30 DIAGNOSIS — F5081 Binge eating disorder: Secondary | ICD-10-CM | POA: Diagnosis not present

## 2021-06-30 DIAGNOSIS — F411 Generalized anxiety disorder: Secondary | ICD-10-CM

## 2021-06-30 DIAGNOSIS — Z789 Other specified health status: Secondary | ICD-10-CM | POA: Diagnosis not present

## 2021-06-30 DIAGNOSIS — Z6834 Body mass index (BMI) 34.0-34.9, adult: Secondary | ICD-10-CM

## 2021-06-30 DIAGNOSIS — E669 Obesity, unspecified: Secondary | ICD-10-CM

## 2021-07-01 ENCOUNTER — Encounter (INDEPENDENT_AMBULATORY_CARE_PROVIDER_SITE_OTHER): Payer: Self-pay

## 2021-07-01 NOTE — Progress Notes (Signed)
Chief Complaint:   OBESITY [name] is here to discuss her progress with her obesity treatment plan along with follow-up of her obesity related diagnoses. See Medical Weight Management Flowsheet for complete bioelectrical impedance results.  Today's visit was #: 23 Starting weight: 185 lbs Starting date: 08/21/2019 Weight change since last visit: +1 lb Total lbs lost to date: 3 lbs Total weight loss percentage to date: -1.62%  Nutrition Plan: Keeping a food journal and adhering to recommended goals of 1000 calories and 80 grams of protein daily for 65% of the time. Activity: None.  Interim History: [name] says that Vyvanse 10-20 mg helps.  She denies sleep impairment.  She says that her "biggest issue is wine".    Assessment/Plan:   1. Impaired fasting glucose, with polyphagia Controlled. Current treatment: None.    Plan: She will continue to focus on protein-rich, low simple carbohydrate foods. We reviewed the importance of hydration, regular exercise for stress reduction, and restorative sleep.  2. Daily consumption of alcohol Encouraged reduction of alcohol consumption.  Provided information for therapist - Sela Hua at [facility].  3. Binge eating disorder [name] is taking Vyvanse 20 mg daily for BED and says it is helpful.  Plan:  Continue Vyvanse at current dose.  The goals for treatment of BED are to reduce eating binges and to achieve healthy eating habits. Because binge eating can correlate with negative emotions, treatment may also address any other mental-health issues, such as depression.  People who binge eat feel as if they don't have control over how much they eat and have feelings of guilt or self-loathing after a binge eating episode.  The FDA has approved Vyvanse as a treatment option for binge eating disorder. Vyvanse targets the brain's reward center by increasing the levels of dopamine and norepinephrine, the chemicals of the brain responsible  for feelings of pleasure.  Mindful eating is the recommended nutritional approach to treating BED.   4. GAD (generalized anxiety disorder) [name] takes Wellbutrin XL 150 mg daily. Motivational interviewing as well as evidence-based interventions for health behavior change were utilized today including the discussion of self monitoring techniques, problem-solving barriers and SMART goal setting techniques.  Specifically regarding patient's less desirable eating habits and patterns, we employed the technique of small changes when she cannot fully commit to her prudent nutritional plan. Provided psychoeducation and facilitated discussion regarding eating as habit, self-monitoring, stimulus control, regulating eating pattern, coping skills, and barriers to success.    Behavior modification techniques were discussed today to help [name] deal with her anxiety.     5. Obesity, current BMI 33.4  Course: [name] is currently in the action stage of change. As such, her goal is to continue with weight loss efforts.   Nutrition goals: She has agreed to keeping a food journal and adhering to recommended goals of 1000 calories and 80 grams of protein.   Exercise goals: For substantial health benefits, adults should do at least 150 minutes (2 hours and 30 minutes) a week of moderate-intensity, or 75 minutes (1 hour and 15 minutes) a week of vigorous-intensity aerobic physical activity, or an equivalent combination of moderate- and vigorous-intensity aerobic activity. Aerobic activity should be performed in episodes of at least 10 minutes, and preferably, it should be spread throughout the week.  Behavioral modification strategies: increasing lean protein intake, decreasing simple carbohydrates, increasing vegetables, and increasing water intake.  [name] has agreed to follow-up with our clinic in 4 weeks. She was informed of the  importance of frequent follow-up visits to maximize her success with intensive lifestyle  modifications for her multiple health conditions.   Objective:   Blood pressure 123/83, pulse 74, temperature (!) 97.5 F (36.4 C), temperature source Oral, height 5\' 2"  (1.575 m), weight 182 lb (82.6 kg), SpO2 96 %. Body mass index is 33.29 kg/m.  General: Cooperative, alert, well developed, in no acute distress. HEENT: Conjunctivae and lids unremarkable. Cardiovascular: Regular rhythm.  Lungs: Normal work of breathing. Neurologic: No focal deficits.   Lab Results  Component Value Date   CREATININE 0.75 12/15/2020   BUN 10 12/15/2020   NA 139 12/15/2020   K 4.1 12/15/2020   CL 101 12/15/2020   CO2 20 12/15/2020   Lab Results  Component Value Date   ALT 33 (H) 12/15/2020   AST 28 12/15/2020   ALKPHOS 51 12/15/2020   BILITOT 0.7 12/15/2020   Lab Results  Component Value Date   HGBA1C 6.0 (H) 12/15/2020   HGBA1C 6.0 (H) 01/23/2020   HGBA1C 6.0 (H) 08/21/2019   Lab Results  Component Value Date   INSULIN 8.6 12/15/2020   INSULIN 12.4 08/21/2019   Lab Results  Component Value Date   TSH 2.280 08/21/2019   Lab Results  Component Value Date   CHOL 250 (H) 12/15/2020   HDL 47 12/15/2020   LDLCALC 180 (H) 12/15/2020   TRIG 125 12/15/2020   CHOLHDL 5.3 (H) 12/15/2020   Lab Results  Component Value Date   VD25OH 46.6 12/15/2020   VD25OH 42.3 01/23/2020   VD25OH 17.7 (L) 08/21/2019   Lab Results  Component Value Date   WBC 7.1 12/15/2020   HGB 14.8 12/15/2020   HCT 43.8 12/15/2020   MCV 83 12/15/2020   PLT 193 12/15/2020   Lab Results  Component Value Date   IRON 91 01/23/2020   TIBC 368 01/23/2020   FERRITIN 480 (H) 12/15/2020   Attestation Statements:   Reviewed by clinician on day of visit: allergies, medications, problem list, medical history, surgical history, family history, social history, and previous encounter notes.  I, 02/14/2021, CMA, am acting as transcriptionist for Insurance claims handler, DO  I have reviewed the above documentation for  accuracy and completeness, and I agree with the above. -  Helane Rima, DO, MS, FAAFP, DABOM - Family and Bariatric Medicine.

## 2021-07-16 ENCOUNTER — Other Ambulatory Visit (INDEPENDENT_AMBULATORY_CARE_PROVIDER_SITE_OTHER): Payer: Self-pay | Admitting: Family Medicine

## 2021-07-16 DIAGNOSIS — E559 Vitamin D deficiency, unspecified: Secondary | ICD-10-CM

## 2021-07-16 DIAGNOSIS — R7301 Impaired fasting glucose: Secondary | ICD-10-CM

## 2021-07-16 NOTE — Telephone Encounter (Signed)
Pt last seen by Dr. Wallace.  

## 2021-07-20 NOTE — Telephone Encounter (Signed)
LOV w/ Dr. Wallace

## 2021-07-23 ENCOUNTER — Other Ambulatory Visit (INDEPENDENT_AMBULATORY_CARE_PROVIDER_SITE_OTHER): Payer: Self-pay | Admitting: Family Medicine

## 2021-07-23 ENCOUNTER — Encounter (INDEPENDENT_AMBULATORY_CARE_PROVIDER_SITE_OTHER): Payer: Self-pay | Admitting: Family Medicine

## 2021-07-23 DIAGNOSIS — F5081 Binge eating disorder: Secondary | ICD-10-CM

## 2021-07-23 DIAGNOSIS — R7301 Impaired fasting glucose: Secondary | ICD-10-CM

## 2021-07-23 DIAGNOSIS — E559 Vitamin D deficiency, unspecified: Secondary | ICD-10-CM

## 2021-07-23 MED ORDER — VYVANSE 20 MG PO CHEW: 20.0000 mg | CHEWABLE_TABLET | Freq: Every morning | ORAL | 0 refills | Status: DC

## 2021-07-23 MED ORDER — VITAMIN D (ERGOCALCIFEROL) 1.25 MG (50000 UNIT) PO CAPS: 50000.0000 [IU] | ORAL_CAPSULE | ORAL | 0 refills | Status: DC

## 2021-07-23 NOTE — Telephone Encounter (Signed)
Pt requests a refill for Ozempic. If a 90 day rx could be sent to [facility] that would be ideal. If that is not possible then please send it to [facility].  90 day RX loaded, please let me know if it needs to be changed to 30 and sent to local

## 2021-07-24 MED ORDER — OZEMPIC (0.25 OR 0.5 MG/DOSE) 2 MG/1.5ML ~~LOC~~ SOPN: 0.5000 mg | PEN_INJECTOR | SUBCUTANEOUS | 0 refills | Status: DC

## 2021-07-31 ENCOUNTER — Other Ambulatory Visit (INDEPENDENT_AMBULATORY_CARE_PROVIDER_SITE_OTHER): Payer: Self-pay | Admitting: Family Medicine

## 2021-07-31 DIAGNOSIS — I10 Essential (primary) hypertension: Secondary | ICD-10-CM

## 2021-08-03 NOTE — Telephone Encounter (Signed)
Dr.Wallace °

## 2021-08-17 ENCOUNTER — Encounter (INDEPENDENT_AMBULATORY_CARE_PROVIDER_SITE_OTHER): Payer: Self-pay

## 2021-08-17 ENCOUNTER — Ambulatory Visit (INDEPENDENT_AMBULATORY_CARE_PROVIDER_SITE_OTHER): Payer: Commercial Managed Care - PPO | Admitting: Family Medicine

## 2021-09-10 ENCOUNTER — Other Ambulatory Visit (INDEPENDENT_AMBULATORY_CARE_PROVIDER_SITE_OTHER): Payer: Self-pay | Admitting: Family Medicine

## 2021-09-10 DIAGNOSIS — F5081 Binge eating disorder: Secondary | ICD-10-CM

## 2021-09-10 DIAGNOSIS — E559 Vitamin D deficiency, unspecified: Secondary | ICD-10-CM

## 2021-09-10 NOTE — Telephone Encounter (Signed)
Refill request

## 2021-09-11 MED ORDER — VYVANSE 20 MG PO CHEW
20.0000 mg | CHEWABLE_TABLET | Freq: Every morning | ORAL | 0 refills | Status: DC
Start: 2021-09-11 — End: 2021-10-07

## 2021-09-11 MED ORDER — VITAMIN D (ERGOCALCIFEROL) 1.25 MG (50000 UNIT) PO CAPS: 50000.0000 [IU] | ORAL_CAPSULE | ORAL | 0 refills | Status: AC

## 2021-09-16 ENCOUNTER — Encounter (INDEPENDENT_AMBULATORY_CARE_PROVIDER_SITE_OTHER): Payer: Self-pay

## 2021-09-16 ENCOUNTER — Other Ambulatory Visit (INDEPENDENT_AMBULATORY_CARE_PROVIDER_SITE_OTHER): Payer: Self-pay | Admitting: Family Medicine

## 2021-09-16 DIAGNOSIS — I10 Essential (primary) hypertension: Secondary | ICD-10-CM

## 2021-09-16 NOTE — Telephone Encounter (Signed)
Dr.Wallace °

## 2021-09-17 ENCOUNTER — Ambulatory Visit (INDEPENDENT_AMBULATORY_CARE_PROVIDER_SITE_OTHER): Payer: Commercial Managed Care - PPO | Admitting: Family Medicine

## 2021-09-20 ENCOUNTER — Other Ambulatory Visit (INDEPENDENT_AMBULATORY_CARE_PROVIDER_SITE_OTHER): Payer: Self-pay | Admitting: Family Medicine

## 2021-09-21 NOTE — Telephone Encounter (Signed)
Dr.Wallace °

## 2021-10-06 ENCOUNTER — Other Ambulatory Visit (INDEPENDENT_AMBULATORY_CARE_PROVIDER_SITE_OTHER): Payer: Self-pay | Admitting: Family Medicine

## 2021-10-07 ENCOUNTER — Ambulatory Visit (INDEPENDENT_AMBULATORY_CARE_PROVIDER_SITE_OTHER): Payer: Commercial Managed Care - PPO | Admitting: Family Medicine

## 2021-10-07 ENCOUNTER — Encounter (INDEPENDENT_AMBULATORY_CARE_PROVIDER_SITE_OTHER): Payer: Self-pay | Admitting: Family Medicine

## 2021-10-07 VITALS — BP 117/83 | HR 77 | Temp 97.7°F | Ht 62.0 in | Wt 182.0 lb

## 2021-10-07 DIAGNOSIS — R7301 Impaired fasting glucose: Secondary | ICD-10-CM | POA: Diagnosis not present

## 2021-10-07 DIAGNOSIS — Z9189 Other specified personal risk factors, not elsewhere classified: Secondary | ICD-10-CM | POA: Diagnosis not present

## 2021-10-07 DIAGNOSIS — E669 Obesity, unspecified: Secondary | ICD-10-CM

## 2021-10-07 DIAGNOSIS — F411 Generalized anxiety disorder: Secondary | ICD-10-CM | POA: Diagnosis not present

## 2021-10-07 DIAGNOSIS — F5081 Binge eating disorder: Secondary | ICD-10-CM

## 2021-10-07 DIAGNOSIS — Z789 Other specified health status: Secondary | ICD-10-CM

## 2021-10-07 DIAGNOSIS — Z6833 Body mass index (BMI) 33.0-33.9, adult: Secondary | ICD-10-CM

## 2021-10-07 MED ORDER — OZEMPIC (0.25 OR 0.5 MG/DOSE) 2 MG/1.5ML ~~LOC~~ SOPN: 0.5000 mg | PEN_INJECTOR | SUBCUTANEOUS | 3 refills | Status: DC

## 2021-10-09 MED ORDER — VYVANSE 20 MG PO CHEW: 20.0000 mg | CHEWABLE_TABLET | Freq: Every morning | ORAL | 0 refills | Status: DC

## 2021-10-09 MED ORDER — VYVANSE 20 MG PO CHEW: 20.0000 mg | CHEWABLE_TABLET | ORAL | 0 refills | Status: DC

## 2021-10-09 NOTE — Progress Notes (Signed)
Chief Complaint:   OBESITY [name] is here to discuss her progress with her obesity treatment plan along with follow-up of her obesity related diagnoses.   Today's visit was #: 24 Starting weight: 185 lbs Starting date: 08/21/2019 Today's weight: 182 lbs Today's date: 10/07/2021 Weight change since last visit: 0 Total lbs lost to date: 3 lbs Body mass index is 33.29 kg/m.  Total weight loss percentage to date: -1.62%  Current Meal Plan: keeping a food journal and adhering to recommended goals of 1200 calories and 80 protein for 50% of the time.  Current Exercise Plan: None at this time. Current Anti-Obesity Medications: Ozempic 0.5 mg subcutaneously weekly. Side effects: None.  Interim History: [name] reports that she has a new partner at work so she is hoping that will help but she is still working into the night. Unfortunately, wine intake is up. She will be going to the beach in a few weeks and plans to wean off wine after that.   Assessment/Plan:   1. Impaired fasting glucose Controlled. Current treatment: Ozempic 0.5 mg subcutaneously weekly.     Plan: She will continue to focus on protein-rich, low simple carbohydrate foods. We reviewed the importance of hydration, regular exercise for stress reduction, and restorative sleep.  - Refill Semaglutide,0.25 or 0.5MG /DOS, (OZEMPIC, 0.25 OR 0.5 MG/DOSE,) 2 MG/1.5ML SOPN; Inject 0.5 mg into the skin once a week.  Dispense: 4.5 mL; Refill: 3  2. Daily consumption of alcohol Encouraged reduction of alcohol consumption.  We will continue to monitor symptoms as they relate to her weight loss journey.  3. Binge eating disorder [name] is taking Vyvanse 20 mg daily for BED and says it is helpful.   Plan:  Refill Vyvanse at current dose.   The goals for treatment of BED are to reduce eating binges and to achieve healthy eating habits. Because binge eating can correlate with  negative emotions, treatment may also address any other mental-health issues, such as depression.   People who binge eat feel as if they don't have control over how much they eat and have feelings of guilt or self-loathing after a binge eating episode.  The FDA has approved Vyvanse as a treatment option for binge eating disorder. Vyvanse targets the brain's reward center by increasing the levels of dopamine and norepinephrine, the chemicals of the brain responsible for feelings of pleasure.  Mindful eating is the recommended nutritional approach to treating BED.   I have consulted the [place] Controlled Substances Registry for this patient, and feel the risk/benefit ratio today is favorable for proceeding with this prescription for a controlled substance. The patient understands monitoring parameters and red flags.   4. GAD (generalized anxiety disorder) [name] takes Wellbutrin XL 150 mg daily. Motivational interviewing as well as evidence-based interventions for health behavior change were utilized today including the discussion of self monitoring techniques, problem-solving barriers and SMART goal setting techniques.  Specifically regarding patient's less desirable eating habits and patterns, we employed the technique of small changes when she cannot fully commit to her prudent nutritional plan. Provided psychoeducation and facilitated discussion regarding eating as habit, self-monitoring, stimulus control, regulating eating pattern, coping skills,  and barriers to success.    Behavior modification techniques were discussed today to help [name] deal with her anxiety.     5. At risk for impaired function of liver [name] is at a higher than average risk of impaired function of the liver.  She was given prevention education and counseling today of more than 8 minutes on this condition. Treatment includes weight loss, elimination of sweet drinks, including juice, avoidance of high fructose corn syrup, and  exercise.  As always, avoiding alcohol consumption and hepatotoxic substances such as excess tylenol is also important.    6. Obesity, current BMI 33.3 Course: [name] is currently in the action stage of change. As such, her goal is to continue with weight loss efforts.   Nutrition goals: She has agreed to keeping a food journal and adhering to recommended goals of 1200 calories and 80 grams of protein.   Exercise goals: Increase NEAT.  Behavioral modification strategies: increasing lean protein intake, decreasing simple carbohydrates, increasing vegetables, increasing water intake, decreasing liquid calories, decreasing alcohol intake, decreasing sodium intake, and increasing high fiber foods.  [name] has agreed to follow-up with our clinic in 6 weeks. She was informed of the importance of frequent follow-up visits to maximize her success with intensive lifestyle modifications for her multiple health conditions.   Objective:   Blood pressure 117/83, pulse 77, temperature 97.7 F (36.5 C), temperature source Oral, height 5\' 2"  (1.575 m), weight 182 lb (82.6 kg), SpO2 95 %. Body mass index is 33.29 kg/m.  General: Cooperative, alert, well developed, in no acute distress. HEENT: Conjunctivae and lids unremarkable. Cardiovascular: Regular rhythm.  Lungs: Normal work of breathing. Neurologic: No focal deficits.   Lab Results  Component Value Date   CREATININE 0.75 12/15/2020   BUN 10 12/15/2020   NA 139 12/15/2020   K 4.1 12/15/2020   CL 101 12/15/2020   CO2 20 12/15/2020   Lab Results  Component Value Date   ALT 33 (H) 12/15/2020   AST 28 12/15/2020   ALKPHOS 51 12/15/2020   BILITOT 0.7 12/15/2020   Lab Results  Component Value Date   HGBA1C 6.0 (H) 12/15/2020   HGBA1C 6.0 (H) 01/23/2020   HGBA1C 6.0 (H) 08/21/2019   Lab Results  Component Value Date   INSULIN 8.6 12/15/2020   INSULIN 12.4 08/21/2019   Lab Results  Component Value Date   TSH 2.280 08/21/2019   Lab  Results  Component Value Date   CHOL 250 (H) 12/15/2020   HDL 47 12/15/2020   LDLCALC 180 (H) 12/15/2020   TRIG 125 12/15/2020   CHOLHDL 5.3 (H) 12/15/2020   Lab Results  Component Value Date   VD25OH 46.6 12/15/2020   VD25OH 42.3 01/23/2020   VD25OH 17.7 (L) 08/21/2019   Lab Results  Component Value Date   WBC 7.1 12/15/2020   HGB 14.8 12/15/2020   HCT 43.8 12/15/2020   MCV 83 12/15/2020   PLT 193 12/15/2020   Lab Results  Component Value Date   IRON 91 01/23/2020   TIBC 368 01/23/2020   FERRITIN 480 (H) 12/15/2020   Attestation Statements:   Reviewed by clinician on day of visit: allergies, medications, problem list, medical history, surgical history, family history, social history, and previous encounter notes.  02/14/2021 Friedenbach, CMA, am acting as 12-[birth year]-1972 for Energy manager, DO.  I have reviewed the above documentation for accuracy and completeness, and I agree with the above. -  W. R. Berkley, DO, MS, FAAFP, DABOM - Family and  Bariatric Medicine.

## 2021-11-09 ENCOUNTER — Ambulatory Visit: Payer: Commercial Managed Care - PPO | Admitting: Cardiovascular Disease

## 2021-11-10 ENCOUNTER — Other Ambulatory Visit: Payer: Self-pay | Admitting: Cardiovascular Disease

## 2021-12-18 ENCOUNTER — Ambulatory Visit: Payer: Commercial Managed Care - PPO | Admitting: Cardiovascular Disease

## 2022-01-14 ENCOUNTER — Encounter: Payer: Self-pay | Admitting: Cardiovascular Disease

## 2022-01-14 ENCOUNTER — Ambulatory Visit: Payer: Commercial Managed Care - PPO | Admitting: Cardiovascular Disease

## 2022-01-14 VITALS — BP 128/72 | HR 86 | Ht 62.0 in | Wt 182.4 lb

## 2022-01-14 DIAGNOSIS — I10 Essential (primary) hypertension: Secondary | ICD-10-CM | POA: Diagnosis not present

## 2022-01-14 DIAGNOSIS — E782 Mixed hyperlipidemia: Secondary | ICD-10-CM | POA: Diagnosis not present

## 2022-01-14 NOTE — Patient Instructions (Signed)
Medication Instructions:  Your physician recommends that you continue on your current medications as directed. Please refer to the Current Medication list given to you today.  *If you need a refill on your cardiac medications before your next appointment, please call your pharmacy*   Testing/Procedures: your provider recommends that you have a CT calcium score.   Follow-Up: At [facility], you and your health needs are our priority.  As part of our continuing mission to provide you with exceptional heart care, we have created designated Provider Care Teams.  These Care Teams include your primary Cardiologist (physician) and Advanced Practice Providers (APPs -  Physician Assistants and Nurse Practitioners) who all work together to provide you with the care you need, when you need it.  Your next appointment:   1 year(s)  The format for your next appointment:   In Person  Provider:   [name], MD     Important Information About Sugar

## 2022-01-14 NOTE — Progress Notes (Signed)
[name] Date of Birth  January 20, 1971 [facility] [address]    [address] [place], [place]  [address] [phone]  Fax  [phone]   Problem list 1. Essential hypertension History of Present Illness:  05/23/12:; 84 a female with a history of hypertension. She has done very well. She was started on labetalol. She tolerates this very well.  She's not had any episodes of chest pain or shortness of breath.  She has a cold now and is on Claritin -D.  She has not been exercising regularly - occasionally once a week.  [name] 20, 2015:  [name] is doing ok  Her father had a CVA and she is having to deal with this issue.    She has not been checking her BP.  She has been feeling well.    Dec. 30, 2015:  [name] is a 51 yo with hx of HTN .  BP is up a bit today because she has not taken her meds today.   Dec. 9, 2016: Doing ok.   Has not been working out as much .  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  Does not always remember to take the 2nd Labetalol tablet No CP or dyspnea.    September 07, 2018: [name]  is seen today after a 2 -year absence.  She saw [name] last year BP has been a little higher . Does not take her labetalol  BID every day  Tries to avoid table salt,   Admits to eating a poor diet Eats pre-packaged and processed foods.    Knows she needs to work out more .  Has tried working out but has slacked off   June 14, 2019: [name] is seen today for follow-up of her hypertension.  I saw her in February and we started her on spironolactone. She has been on labetalol 300 mg twice a day. She ran out of her spironolactone last week .  Has been taking her Labetalol   Her bp when she was on both meds was 130s - 140s Diastolic was 80-90 Felt lightheaded today.    October 21, 2020: [name] is seen today for follow-up of her hypertension. Was  running low on her spironolactone so she was taking it every other day .  Not getting regular exercise ,  Still raising her 51 yo  Going to [employer].   Advised her to avoid foods that are white, wheat, sweat .   January 14, 2022;  [name] is feeling well Has changed to the long acting Inderal  Is exercising some  Walks several times a week  No cp, no dyspnea  Working on her weight .  Wt is 182  today  On ozympic now   Current Outpatient Medications on File Prior to Visit  Medication Sig Dispense Refill   albuterol (PROVENTIL HFA;VENTOLIN HFA) 108 (90 Base) MCG/ACT inhaler Inhale 2 puffs into the lungs every 4 (four) hours as needed.      buPROPion (WELLBUTRIN XL) 150 MG 24 hr tablet TAKE 1 TABLET BY MOUTH  DAILY 90 tablet 3   hyoscyamine (LEVSIN SL) 0.125 MG SL tablet PLACE 1 TABLET UNDER THE TONGUE AND ALLOW TO DISSOLVE AS NEEDED EVERY 4 HRS IF NEEDED  0   Insulin Pen Needle 32G X 4 MM MISC 1 each by Does not apply  route once a week. 10 each 0   Lisdexamfetamine Dimesylate (VYVANSE) 20 MG CHEW Chew 20 mg by mouth every morning. 30 tablet 0   Multiple Vitamin (MULTIVITAMIN) capsule Take 1 capsule by mouth daily.     propranolol ER (INDERAL LA) 80 MG 24 hr capsule TAKE 1 CAPSULE BY MOUTH DAILY 90 capsule 3   Semaglutide,0.25 or 0.5MG /DOS, (OZEMPIC, 0.25 OR 0.5 MG/DOSE,) 2 MG/1.5ML SOPN Inject 0.5 mg into the skin once a week. 4.5 mL 3   spironolactone (ALDACTONE) 50 MG tablet TAKE 1 TABLET BY MOUTH EVERY DAY 30 tablet 1   valACYclovir (VALTREX) 500 MG tablet Use as directed     Vitamin D, Ergocalciferol, (DRISDOL) 1.25 MG (50000 UNIT) CAPS capsule Take 1 capsule (50,000 Units total) by mouth every 7 (seven) days. 12 capsule 0   No current facility-administered medications on file prior to visit.    Allergies  Allergen Reactions   Motrin [Ibuprofen] Swelling   Other Other (See Comments)    Mold,cat & dog hair, raw carrots,raw apples, burch &mapple trees,  and ragweed   Penicillins Hives   Sulfa Antibiotics Hives    Past Medical History:  Diagnosis Date   Anemia    Asthma    Back pain    Constipated    GERD (gastroesophageal reflux disease)    High cholesterol    Hypertension    IBS (irritable bowel syndrome)    Joint pain    Lactose intolerance    Mitral valve prolapse    Multiple food allergies    Palpitation    Panic attack    Right knee pain    Seasonal allergies     Past Surgical History:  Procedure Laterality Date   BREAST FIBROADENOMA SURGERY     BREAST LUMPECTOMY     MYOMECTOMY      Social History   Tobacco Use  Smoking Status Never  Smokeless Tobacco Never    Social History   Substance and Sexual Activity  Alcohol Use Never    Family History  Problem Relation Age of Onset   Hypertension Mother    Hyperlipidemia Mother    Diabetes Mother    Heart disease Mother    Cancer Mother    Obesity Mother    Hypertension Father    Hyperlipidemia Father    Diabetes Father    Stroke Father    Alcoholism Father    Heart attack Maternal Uncle    Diabetes Maternal Grandmother    Hyperlipidemia Maternal Grandmother    Hypertension Maternal Grandmother    Sudden death Neg Hx     Reviw of Systems:  Reviewed in the HPI.  All other systems are negative.   Physical Exam: Blood pressure 128/72, pulse 86, height 5\' 2"  (1.575 m), weight 182 lb 6.4 oz (82.7 kg), SpO2 98 %.  GEN:  Well nourished, well developed in no acute distress, mildly obese, very pleasant  HEENT: Normal NECK: No JVD; No carotid bruits LYMPHATICS: No lymphadenopathy CARDIAC: RRR , no murmurs, rubs, gallops RESPIRATORY:  Clear to auscultation without rales, wheezing or rhonchi  ABDOMEN: Soft, non-tender, non-distended MUSCULOSKELETAL:  No edema; No deformity  SKIN: Warm and dry NEUROLOGIC:  Alert and oriented x 3   ECG: January 13, 2022: Sinus rhythm with occasional premature ventricular contraction.  Poor R wave progression-likely due  to lead placement.  Assessment / Plan:    1. Essential hypertension: Her blood pressure is very well controlled.  Continue current medications.  Her labs look  great.   2. Hyperlipidemia: -Last LDL was 153.  We will get a coronary calcium score to assess for risk factors.  She does have a family history of cardiac disease.    We will see her again in 1 year.    [name], MD  01/14/2022 1:41 PM    [facility] [address] [place],  [address] [place], [place]  [address] Pager [phone] Phone: [phone]; Fax: [phone]

## 2022-01-15 ENCOUNTER — Ambulatory Visit (HOSPITAL_BASED_OUTPATIENT_CLINIC_OR_DEPARTMENT_OTHER)
Admission: RE | Admit: 2022-01-15 | Discharge: 2022-01-15 | Disposition: A | Discharge status: Home / Self Care | Payer: Commercial Managed Care - PPO | Source: Ambulatory Visit | Attending: Cardiovascular Disease | Admitting: Cardiovascular Disease

## 2022-01-15 DIAGNOSIS — I10 Essential (primary) hypertension: Secondary | ICD-10-CM | POA: Insufficient documentation

## 2022-01-15 DIAGNOSIS — E782 Mixed hyperlipidemia: Secondary | ICD-10-CM | POA: Insufficient documentation

## 2022-01-18 ENCOUNTER — Telehealth: Payer: Self-pay

## 2022-01-18 DIAGNOSIS — E782 Mixed hyperlipidemia: Secondary | ICD-10-CM

## 2022-01-18 MED ORDER — ROSUVASTATIN CALCIUM 20 MG PO TABS: 20.0000 mg | ORAL_TABLET | Freq: Every evening | ORAL | 3 refills | Status: DC

## 2022-01-18 NOTE — Telephone Encounter (Signed)
-----   Message from Vesta Mixer, MD sent at 01/17/2022  7:57 AM EDT ----- Coronary calcium score of 259. This was 99th percentile for age-, race-, and sex-matched controls. Her LDL is 180 Please start rosuvastatin 20 mg a day  She needs a low fat, low cholesterol diet, increase exercise Recheck lipids and ALT in 3 months  If she does not reach her goal with rosuvastatin,  will have a low threshold to refer her to the lipid clinic to consider PCSK9 inhibitor

## 2022-01-18 NOTE — Telephone Encounter (Signed)
Called and spoke with patient about results and recommendations. All questions answered and she agrees to plan. Medication sent to mail order per request and labs ordered and scheduled for 04/27/22

## 2022-02-13 ENCOUNTER — Other Ambulatory Visit: Payer: Self-pay | Admitting: Cardiovascular Disease

## 2022-02-17 ENCOUNTER — Encounter (INDEPENDENT_AMBULATORY_CARE_PROVIDER_SITE_OTHER): Payer: Self-pay

## 2022-02-24 ENCOUNTER — Other Ambulatory Visit: Payer: Self-pay | Admitting: Cardiovascular Disease

## 2022-04-27 ENCOUNTER — Other Ambulatory Visit: Payer: Commercial Managed Care - PPO

## 2022-04-28 ENCOUNTER — Ambulatory Visit: Payer: Commercial Managed Care - PPO | Attending: Cardiovascular Disease

## 2022-04-28 DIAGNOSIS — E782 Mixed hyperlipidemia: Secondary | ICD-10-CM

## 2022-04-28 LAB — LIPID PANEL
Chol/HDL Ratio: 4.1 ratio (ref 0.0–4.4)
Cholesterol, Total: 156 mg/dL (ref 100–199)
HDL: 38 mg/dL — ABNORMAL LOW (ref >39)
LDL Chol Calc (NIH): 85 mg/dL (ref 0–99)
Triglycerides: 192 mg/dL — ABNORMAL HIGH (ref 0–149)
VLDL Cholesterol Cal: 33 mg/dL (ref 5–40)

## 2022-04-28 LAB — ALT: ALT: 22 IU/L (ref 0–32)

## 2022-04-30 ENCOUNTER — Telehealth: Payer: Self-pay

## 2022-04-30 ENCOUNTER — Other Ambulatory Visit: Payer: Commercial Managed Care - PPO

## 2022-04-30 DIAGNOSIS — Z79899 Other long term (current) drug therapy: Secondary | ICD-10-CM

## 2022-04-30 NOTE — Telephone Encounter (Signed)
-----   Message from Thayer Headings, MD sent at 04/30/2022  4:47 PM EDT ----- LDL is much better .  Continue rosuvastatin Her triglyceride levels are elevated. Lets start fenofibrate 145 mg a day  She should limit all of her carb intake Increase exercise Recheck lipids and ALT in 3 months

## 2022-04-30 NOTE — Telephone Encounter (Signed)
Patient called.  Patient aware. States that she wants to avoid the Fenofibrate at this time. She wants to work on carb reduction further and will start incorporating Omega 3 into her daily regimen as well. She requested labs to check her progress and will consider the medication at that time if needed. Labs placed and scheduled for 08/03/22.

## 2022-08-03 ENCOUNTER — Other Ambulatory Visit: Payer: Commercial Managed Care - PPO

## 2022-08-11 ENCOUNTER — Other Ambulatory Visit: Payer: Commercial Managed Care - PPO

## 2023-01-04 ENCOUNTER — Other Ambulatory Visit: Payer: Self-pay | Admitting: Cardiovascular Disease

## 2023-01-20 ENCOUNTER — Telehealth: Payer: Self-pay | Admitting: Cardiovascular Disease

## 2023-01-20 MED ORDER — ROSUVASTATIN CALCIUM 20 MG PO TABS: 20.0000 mg | ORAL_TABLET | Freq: Every evening | ORAL | 1 refills | Status: DC

## 2023-01-20 NOTE — Telephone Encounter (Signed)
Pt's medication was sent to pt's pharmacy as requested. Confirmation received.  °

## 2023-01-20 NOTE — Telephone Encounter (Signed)
*  STAT* If patient is at the pharmacy, call can be transferred to refill team.   1. Which medications need to be refilled? (please list name of each medication and dose if known) rosuvastatin (CRESTOR) 20 MG tablet   2. Which pharmacy/location (including street and city if local pharmacy) is medication to be sent to? [id number] - [place], [place] - [address] AT [address]OSS FROM [address]   3. Do they need a 30 day or 90 day supply? 90 day  Patient is completely out of meds and has an appt scheduled for 10/07.

## 2023-02-28 ENCOUNTER — Other Ambulatory Visit: Payer: Self-pay | Admitting: Obstetrics and Gynecology

## 2023-02-28 DIAGNOSIS — R928 Other abnormal and inconclusive findings on diagnostic imaging of breast: Secondary | ICD-10-CM

## 2023-03-10 ENCOUNTER — Ambulatory Visit
Admission: RE | Admit: 2023-03-10 | Discharge: 2023-03-10 | Disposition: A | Discharge status: Home / Self Care | Payer: Commercial Managed Care - PPO | Source: Ambulatory Visit | Attending: Obstetrics and Gynecology | Admitting: Obstetrics and Gynecology

## 2023-03-10 DIAGNOSIS — R928 Other abnormal and inconclusive findings on diagnostic imaging of breast: Secondary | ICD-10-CM

## 2023-03-15 ENCOUNTER — Encounter: Payer: Self-pay | Admitting: Cardiovascular Disease

## 2023-03-15 NOTE — Progress Notes (Signed)
[name] Date of Birth  1970/09/11 [facility] [address]    [address] [place], [place]  [address] [phone]  Fax  [phone]   Problem list 1. Essential hypertension History of Present Illness:  05/23/12:; 52 a female with a history of hypertension. She has done very well. She was started on labetalol. She tolerates this very well.  She's not had any episodes of chest pain or shortness of breath.  She has a cold now and is on Claritin -D.  She has not been exercising regularly - occasionally once a week.  [name] 20, 2015:  [name] is doing ok  Her father had a CVA and she is having to deal with this issue.    She has not been checking her BP.  She has been feeling well.    Dec. 30, 2015:  [name] is a 52 yo with hx of HTN .  BP is up a bit today because she has not taken her meds today.   Dec. 9, 2016: Doing ok.   Has not been working out as much .  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  Does not always remember to take the 2nd Labetalol tablet No CP or dyspnea.    September 07, 2018: [name]  is seen today after a 2 -year absence.  She saw [name] last year BP has been a little higher . Does not take her labetalol  BID every day  Tries to avoid table salt,   Admits to eating a poor diet Eats pre-packaged and processed foods.    Knows she needs to work out more .  Has tried working out but has slacked off   June 14, 2019: [name] is seen today for follow-up of her hypertension.  I saw her in February and we started her on spironolactone. She has been on labetalol 300 mg twice a day. She ran out of her spironolactone last week .  Has been taking her Labetalol   Her bp when she was on both meds was 130s - 140s Diastolic was 80-90 Felt lightheaded today.    October 21, 2020: [name] is seen today for follow-up of her hypertension. Was  running low on her spironolactone so she was taking it every other day .  Not getting regular exercise ,  Still raising her 52 yo  Going to [employer].   Advised her to avoid foods that are white, wheat, sweat .   January 14, 2022;  [name] is feeling well Has changed to the long acting Inderal  Is exercising some  Walks several times a week  No cp, no dyspnea  Working on her weight .  Wt is 182  today  On ozympic now   Sept. 4, 2024 [name] is seen for follow  up of her HTN No Cp no dyspnea  Wt is 182  Continues to work on her weight   Knows that she eats too many carbs   Is trying to stay active  Works for an Scient[employer], [employer]      Current Outpatient Medications on File Prior to Visit  Medication Sig Dispense Refill   albuterol (PROVENTIL HFA;VENTOLIN HFA) 108 (90 Base) MCG/ACT inhaler Inhale 2 puffs into the lungs every 4 (four) hours as needed.      Biotin w/ Vitamins C & E (HAIR  SKIN & NAILS GUMMIES) 1250-7.5-7.5 MCG-MG-UNT CHEW Take 1 chewable by mouth once daily.     buPROPion (WELLBUTRIN XL) 150 MG 24 hr tablet TAKE 1 TABLET BY MOUTH  DAILY 90 tablet 3   cetirizine (ZYRTEC) 10 MG tablet 1 tablet Orally Once a day as needed.     Cyanocobalamin 3000 MCG SUBL 1 tablet Orally Once a day     flunisolide (NASALIDE) 25 MCG/ACT (0.025%) SOLN 2 SPRAYS IN EACH NOSTRIL NASALLY TWICE A DAY AS NEEDED     hyoscyamine (LEVSIN SL) 0.125 MG SL tablet PLACE 1 TABLET UNDER THE TONGUE AND ALLOW TO DISSOLVE AS NEEDED EVERY 4 HRS IF NEEDED  0   Insulin Pen Needle 32G X 4 MM MISC 1 each by Does not apply route once a week. 10 each 0   Lisdexamfetamine Dimesylate (VYVANSE) 20 MG CHEW Chew 20 mg by mouth every morning. 30 tablet 0   Lisdexamfetamine Dimesylate 40 MG CHEW Take 1 tablet by mouth once daily in the morning. for 30 days     Multiple Vitamin (MULTIVITAMIN) capsule Take 1 capsule by mouth daily.     rosuvastatin (CRESTOR) 20 MG tablet Take 1 tablet (20 mg  total) by mouth every evening. 90 tablet 1   Semaglutide,0.25 or 0.5MG /DOS, (OZEMPIC, 0.25 OR 0.5 MG/DOSE,) 2 MG/1.5ML SOPN Inject 0.5 mg into the skin once a week. 4.5 mL 3   Semaglutide,0.25 or 0.5MG /DOS, (OZEMPIC, 0.25 OR 0.5 MG/DOSE,) 2 MG/3ML SOPN Inject 0.5mg  under the skin once weekly.     spironolactone (ALDACTONE) 50 MG tablet TAKE 1 TABLET BY MOUTH  DAILY 90 tablet 3   valACYclovir (VALTREX) 500 MG tablet Use as directed     Vitamin D, Ergocalciferol, (DRISDOL) 1.25 MG (50000 UNIT) CAPS capsule Take 1 capsule (50,000 Units total) by mouth every 7 (seven) days. 12 capsule 0   No current facility-administered medications on file prior to visit.    Allergies  Allergen Reactions   Motrin [Ibuprofen] Swelling   Other Other (See Comments)    Mold,cat & dog hair, raw carrots,raw apples, burch &mapple trees, and ragweed   Penicillins Hives   Sulfa Antibiotics Hives    Past Medical History:  Diagnosis Date   Anemia    Asthma    Back pain    Constipated    GERD (gastroesophageal reflux disease)    High cholesterol    Hypertension    IBS (irritable bowel syndrome)    Joint pain    Lactose intolerance    Mitral valve prolapse    Multiple food allergies    Palpitation    Panic attack    Right knee pain    Seasonal allergies     Past Surgical History:  Procedure Laterality Date   BREAST FIBROADENOMA SURGERY     BREAST LUMPECTOMY     MYOMECTOMY      Social History   Tobacco Use  Smoking Status Never  Smokeless Tobacco Never    Social History   Substance and Sexual Activity  Alcohol Use Never    Family History  Problem Relation Age of Onset   Hypertension Mother    Hyperlipidemia Mother    Diabetes Mother    Heart disease Mother    Cancer Mother    Obesity Mother    Hypertension Father    Hyperlipidemia Father    Diabetes Father    Stroke Father    Alcoholism Father    Heart attack Maternal Uncle    Diabetes Maternal Grandmother  Hyperlipidemia  Maternal Grandmother    Hypertension Maternal Grandmother    Sudden death Neg Hx     Reviw of Systems:  Reviewed in the HPI.  All other systems are negative.   Physical Exam: Blood pressure 124/80, pulse 88, height 5' 2.5" (1.588 m), weight 182 lb 9.6 oz (82.8 kg), SpO2 95%.       GEN: moderately obese , young female  in no acute distress HEENT: Normal NECK: No JVD; No carotid bruits LYMPHATICS: No lymphadenopathy CARDIAC: RRR , no murmurs, rubs, gallops RESPIRATORY:  Clear to auscultation without rales, wheezing or rhonchi  ABDOMEN: Soft, non-tender, non-distended MUSCULOSKELETAL:  No edema; No deformity  SKIN: Warm and dry NEUROLOGIC:  Alert and oriented x 3    ECG:    EKG Interpretation Date/Time:  Wednesday March 16 2023 16:42:39 EDT Ventricular Rate:  88 PR Interval:  206 QRS Duration:  76 QT Interval:  372 QTC Calculation: 450 R Axis:   -48  Text Interpretation: Normal sinus rhythm Low voltage QRS Left anterior fascicular block Cannot rule out Anterior infarct , age undetermined When compared with ECG of 23-Feb-2016 10:59, No significant change since last tracing Confirmed by [name] [phone]) on 03/16/2023 5:11:43 PM    Assessment / Plan:    1. Essential hypertension:   BP is well controlled.     2. Hyperlipidemia: -  managed by primry   3. Obesity :   advised weight loss  More exercise, fewer carbs.      We will see her again in 1 year.    [name], MD  03/16/2023 5:10 PM    [facility] [address] [place],  [address] [place], [place]  [address] Pager [phone] Phone: [phone]; Fax: [phone]

## 2023-03-16 ENCOUNTER — Ambulatory Visit: Payer: Commercial Managed Care - PPO | Attending: Cardiovascular Disease | Admitting: Cardiovascular Disease

## 2023-03-16 ENCOUNTER — Encounter: Payer: Self-pay | Admitting: Cardiovascular Disease

## 2023-03-16 VITALS — BP 124/80 | HR 88 | Ht 62.5 in | Wt 182.6 lb

## 2023-03-16 DIAGNOSIS — I10 Essential (primary) hypertension: Secondary | ICD-10-CM

## 2023-03-16 MED ORDER — PROPRANOLOL HCL ER 80 MG PO CP24: 80.0000 mg | ORAL_CAPSULE | Freq: Every day | ORAL | 3 refills | Status: AC

## 2023-03-16 NOTE — Patient Instructions (Signed)
Medication Instructions:  REFILLED Propranolol ER 80mg  daily *If you need a refill on your cardiac medications before your next appointment, please call your pharmacy*   Lab Work: NONE If you have labs (blood work) drawn today and your tests are completely normal, you will receive your results only by: MyChart Message (if you have MyChart) OR A paper copy in the mail If you have any lab test that is abnormal or we need to change your treatment, we will call you to review the results.   Testing/Procedures: NONE   Follow-Up: At [facility], you and your health needs are our priority.  As part of our continuing mission to provide you with exceptional heart care, we have created designated Provider Care Teams.  These Care Teams include your primary Cardiologist (physician) and Advanced Practice Providers (APPs -  Physician Assistants and Nurse Practitioners) who all work together to provide you with the care you need, when you need it.  We recommend signing up for the patient portal called "MyChart".  Sign up information is provided on this After Visit Summary.  MyChart is used to connect with patients for Virtual Visits (Telemedicine).  Patients are able to view lab/test results, encounter notes, upcoming appointments, etc.  Non-urgent messages can be sent to your provider as well.   To learn more about what you can do with MyChart, go to [email or web address].    Your next appointment:   1 year(s)  Provider:   [name], MD

## 2023-04-18 ENCOUNTER — Ambulatory Visit: Payer: Commercial Managed Care - PPO | Admitting: Cardiovascular Disease

## 2023-04-20 ENCOUNTER — Other Ambulatory Visit: Payer: Self-pay | Admitting: Cardiovascular Disease

## 2024-02-27 ENCOUNTER — Other Ambulatory Visit: Payer: Self-pay | Admitting: Physician Assistant

## 2024-02-29 ENCOUNTER — Other Ambulatory Visit: Payer: Self-pay | Admitting: Physician Assistant

## 2024-02-29 MED ORDER — SPIRONOLACTONE 50 MG PO TABS: 50.0000 mg | ORAL_TABLET | Freq: Every day | ORAL | 0 refills | Status: DC

## 2024-04-04 ENCOUNTER — Other Ambulatory Visit: Payer: Self-pay

## 2024-04-06 ENCOUNTER — Other Ambulatory Visit: Payer: Self-pay

## 2024-04-06 MED ORDER — ROSUVASTATIN CALCIUM 20 MG PO TABS: 20.0000 mg | ORAL_TABLET | Freq: Every evening | ORAL | 0 refills | Status: DC

## 2024-04-23 ENCOUNTER — Ambulatory Visit: Payer: Self-pay

## 2024-04-23 ENCOUNTER — Other Ambulatory Visit: Payer: Self-pay | Admitting: Obstetrics and Gynecology

## 2024-04-23 DIAGNOSIS — R928 Other abnormal and inconclusive findings on diagnostic imaging of breast: Secondary | ICD-10-CM

## 2024-04-23 NOTE — Telephone Encounter (Signed)
 FYI Only or Action Required?: FYI only for provider.  Patient was last seen in primary care on 10/07/2021 by Prentiss Frieze, DO.  Called Nurse Triage reporting Medication Problem.  Symptoms began several weeks ago.  Interventions attempted: Nothing.  Symptoms are: stable.  Triage Disposition: Information or Advice Only Call  Patient/caregiver understands and will follow disposition?: Yes                             Copied from CRM [id number]. Topic: Clinical - Red Word Triage >> Apr 23, 2024  2:19 PM Martinique E wrote: [facility] that prompted transfer to Nurse Triage: Injection site where patient administers Mounjaro is red, itchy, and swollen. Reason for Disposition  Caller has medicine question only, adult not sick, AND triager answers question  Answer Assessment - Initial Assessment Questions 1. NAME of MEDICINE: What medicine(s) are you calling about?     Mounjaro  2. QUESTION: What is your question? (e.g., double dose of medicine, side effect)     Injection site is red, itchy and swollen- is this normal? 3. PRESCRIBER: Who prescribed the medicine? Reason: if prescribed by specialist, call should be referred to that group.     Frieze Prentiss at [place] 4. SYMPTOMS: Do you have any symptoms? If Yes, ask: What symptoms are you having?  How bad are the symptoms (e.g., mild, moderate, severe)     Redness, itching and swelling of injection site 5. PREGNANCY:  Is there any chance that you are pregnant? When was your last menstrual period?     N/A   Patient stated last 3-4 x injections of Mounjaro have caused redness, itching and swelling at injection site. Patient stated symptoms taper off after 3-4 days, but the itching can sometimes linger. Patient denied difficulty breathing, difficulty swallowing, fever and rash. This RN advised home care at this time and advised patient to try taking an antihistamine for relief from symptoms. This RN advised  patient to call back if symptoms ever become severe or if she does not get relief after taking an antihistamine. Patient verbalized understanding.  Protocols used: Medication Question Call-A-AH

## 2024-05-03 ENCOUNTER — Other Ambulatory Visit: Payer: Self-pay | Admitting: Physician Assistant

## 2024-05-07 ENCOUNTER — Ambulatory Visit

## 2024-05-07 ENCOUNTER — Ambulatory Visit
Admission: RE | Admit: 2024-05-07 | Discharge: 2024-05-07 | Disposition: A | Discharge status: Home / Self Care | Source: Ambulatory Visit | Attending: Obstetrics and Gynecology | Admitting: Obstetrics and Gynecology

## 2024-05-07 DIAGNOSIS — R928 Other abnormal and inconclusive findings on diagnostic imaging of breast: Secondary | ICD-10-CM

## 2024-05-17 ENCOUNTER — Other Ambulatory Visit: Payer: Self-pay | Admitting: Physician Assistant

## 2024-05-26 ENCOUNTER — Other Ambulatory Visit: Payer: Self-pay | Admitting: Physician Assistant

## 2024-05-31 ENCOUNTER — Telehealth: Payer: Self-pay | Admitting: Physician Assistant

## 2024-06-21 ENCOUNTER — Encounter: Payer: Self-pay | Admitting: Family Medicine

## 2024-06-21 ENCOUNTER — Ambulatory Visit: Admitting: Family Medicine

## 2024-06-21 VITALS — BP 126/84 | HR 80 | Ht 62.75 in | Wt 183.4 lb

## 2024-06-21 DIAGNOSIS — Z789 Other specified health status: Secondary | ICD-10-CM

## 2024-06-21 DIAGNOSIS — F900 Attention-deficit hyperactivity disorder, predominantly inattentive type: Secondary | ICD-10-CM

## 2024-06-21 DIAGNOSIS — E1169 Type 2 diabetes mellitus with other specified complication: Secondary | ICD-10-CM

## 2024-06-21 DIAGNOSIS — M25561 Pain in right knee: Secondary | ICD-10-CM | POA: Diagnosis not present

## 2024-06-21 DIAGNOSIS — G8929 Other chronic pain: Secondary | ICD-10-CM | POA: Diagnosis not present

## 2024-06-21 DIAGNOSIS — Z6832 Body mass index (BMI) 32.0-32.9, adult: Secondary | ICD-10-CM | POA: Diagnosis not present

## 2024-06-21 DIAGNOSIS — Z23 Encounter for immunization: Secondary | ICD-10-CM | POA: Diagnosis not present

## 2024-06-21 DIAGNOSIS — H9193 Unspecified hearing loss, bilateral: Secondary | ICD-10-CM

## 2024-06-21 DIAGNOSIS — E66811 Obesity, class 1: Secondary | ICD-10-CM | POA: Diagnosis not present

## 2024-06-21 DIAGNOSIS — Z7985 Long-term (current) use of injectable non-insulin antidiabetic drugs: Secondary | ICD-10-CM | POA: Diagnosis not present

## 2024-06-21 NOTE — Progress Notes (Unsigned)
 Assessment & Plan:   1. Immunization due   2. Type 2 diabetes mellitus with other specified complication, without long-term current use of insulin  (HCC)   3. Decreased hearing of both ears   4. Chronic pain of right knee   5. ADHD (attention deficit hyperactivity disorder), inattentive type   6. Alcohol consuption of more than two drinks per day   7. Class 1 obesity with serious comorbidity and body mass index (BMI) of 32.0 to 32.9 in adult, unspecified obesity type    Meds ordered this encounter  Medications   clonazePAM  (KLONOPIN ) 0.5 MG tablet    Sig: Take 1 tablet (0.5 mg total) by mouth daily.    Dispense:  30 tablet    Refill:  0   Assessment and Plan Assessment & Plan Right knee pain and instability Chronic pain and instability with functional impairment. Previous cortisone injections provided temporary relief. Considering total knee replacement due to persistent symptoms. - Discussed potential for total knee replacement.  Hearing loss and tinnitus Progressive hearing loss and occasional tinnitus, possibly post-COVID. - Referred to audiology for hearing assessment.  Type 2 diabetes mellitus Managed with Mounjaro  2.5 mg. Stable weight and improved glucose control. Skin reactions noted, may increase with dose escalation. A1c was 6.4% in May. - Increased Mounjaro  dosage. - Instructed to monitor for increased skin reactions. - Will consider alternative medications if skin reactions worsen.  Alcohol use disorder Reduced alcohol consumption, primarily in evenings. Exploring options for managing cravings. Discussed Klonopin  for short-term use to aid reduction, acknowledging dependency risk. - Prescribed Klonopin  for evening use. - Monitor response to Klonopin  and adjust as needed.  Attention-deficit/hyperactivity disorder Managed with Vyvanse . Improvement in organizational skills and daily functioning. Discussed potential future reduction of Vyvanse . Acknowledged  hormonal impact on symptoms. - Continue Vyvanse .  I personally spent a total of 46 minutes in the care of the patient today including preparing to see the patient, performing a medically appropriate exam/evaluation, counseling and educating, placing orders, and documenting clinical information in the EHR.   Geni Shutter, DO, MS, FAAFP, Dipl. [facility] [address] [place], [address] Dept: [phone] Dept Fax: [phone]  Subjective:   Patient is well-known to me from previous care setting and is establishing care in this system with me as PCP. Prior records reviewed when available. Chart updated today with reconciliation of problem list, medications, allergies, and relevant history. Preventive care and chronic disease status reviewed. Portions of historical chart may remain incomplete; will update on an ongoing basis as clinically indicated.  Discussed the use of AI scribe software for clinical note transcription with the patient, who gave verbal consent to proceed.  History of Present Illness [name] is a 53 year old female who presents with knee pain and hearing concerns.  Right knee pain and mechanical symptoms - Significant right knee pain, worsened with walking and camping - Difficulty with stairs, requiring use of arms to pull herself up on two occasions - Associated swelling and occasional knee instability ('giving out') - History of prior cortisone injections for knee pain  Auditory changes and tinnitus - Decreased hearing with occasional tinnitus - Difficulty perceiving sounds that others notice - Onset of symptoms linked to post-COVID changes  Gustatory disturbance - Persistent dullness in taste since COVID infection  Diabetes mellitus and weight management - Takes Mounjaro  2.5 mg for diabetes and weight management - Weight remains stable without expected loss - Intermittent nausea requiring  medication - Prior injection site skin reactions, currently improving - Previously used Ozempic   Alcohol use - Improvement in alcohol use, avoiding daytime and late-night drinking - Continues to drink in the evenings - Previous attempts at therapy and medication for alcohol use  Attention deficit symptoms - Utilizes organization strategies with support from her partner - Takes Vyvanse  and Wellbutrin  for symptom management  Review of Systems: Negative, with the exception of above mentioned in HPI. Current Medications[1]   Objective:   BP 126/84 (BP Location: Left Arm, Cuff Size: Large)   Pulse 80   Ht 5' 2.75 (1.594 m)   Wt 183 lb 6.4 oz (83.2 kg)   LMP 08/18/2019   SpO2 97%   BMI 32.75 kg/m   Physical Exam Constitutional:      General: She is not in acute distress.    Appearance: She is well-developed.  HENT:     Head: Normocephalic and atraumatic.  Eyes:     Conjunctiva/sclera: Conjunctivae normal.  Cardiovascular:     Rate and Rhythm: Normal rate and regular rhythm.     Heart sounds: Normal heart sounds.  Pulmonary:     Effort: Pulmonary effort is normal.     Breath sounds: Normal breath sounds.  Neurological:     General: No focal deficit present.     Mental Status: She is alert.  Psychiatric:        Behavior: Behavior normal.   Note: Other outside labs reviewed and will be abstracted. Below labs are the most recent in local EPIC.   Lab Results  Component Value Date   CREATININE 0.75 12/15/2020   BUN 10 12/15/2020   NA 139 12/15/2020   K 4.1 12/15/2020   CL 101 12/15/2020   CO2 20 12/15/2020   Lab Results  Component Value Date   ALT 22 04/28/2022   AST 28 12/15/2020   ALKPHOS 51 12/15/2020   BILITOT 0.7 12/15/2020   Lab Results  Component Value Date   HGBA1C 6.0 (H) 12/15/2020   HGBA1C 6.0 (H) 01/23/2020   HGBA1C 6.0 (H) 08/21/2019   Lab Results  Component Value Date   INSULIN  8.6 12/15/2020   INSULIN  12.4 08/21/2019   Lab Results   Component Value Date   TSH 2.280 08/21/2019   Lab Results  Component Value Date   CHOL 156 04/28/2022   HDL 38 (L) 04/28/2022   LDLCALC 85 04/28/2022   TRIG 192 (H) 04/28/2022   CHOLHDL 4.1 04/28/2022   Lab Results  Component Value Date   VD25OH 46.6 12/15/2020   VD25OH 42.3 01/23/2020   VD25OH 17.7 (L) 08/21/2019   Lab Results  Component Value Date   WBC 7.1 12/15/2020   HGB 14.8 12/15/2020   HCT 43.8 12/15/2020   MCV 83 12/15/2020   PLT 193 12/15/2020   Lab Results  Component Value Date   IRON 91 01/23/2020   TIBC 368 01/23/2020   FERRITIN 480 (H) 12/15/2020       [1]  Current Outpatient Medications:    albuterol (PROVENTIL HFA;VENTOLIN HFA) 108 (90 Base) MCG/ACT inhaler, Inhale 2 puffs into the lungs every 4 (four) hours as needed. , Disp: , Rfl:    Biotin w/ Vitamins C & E (HAIR SKIN & NAILS GUMMIES) 1250-7.5-7.5 MCG-MG-UNT CHEW, Take 1 chewable by mouth once daily., Disp: , Rfl:    buPROPion  (WELLBUTRIN  XL) 150 MG 24 hr tablet, TAKE 1 TABLET BY MOUTH  DAILY, Disp: 90 tablet, Rfl: 3   cetirizine (ZYRTEC) 10 MG tablet,  1 tablet Orally Once a day as needed., Disp: , Rfl:    clonazePAM  (KLONOPIN ) 0.5 MG tablet, Take 1 tablet (0.5 mg total) by mouth daily., Disp: 30 tablet, Rfl: 0   Cyanocobalamin 3000 MCG SUBL, 1 tablet Orally Once a day, Disp: , Rfl:    flunisolide (NASALIDE) 25 MCG/ACT (0.025%) SOLN, 2 SPRAYS IN EACH NOSTRIL NASALLY TWICE A DAY AS NEEDED, Disp: , Rfl:    hyoscyamine (LEVSIN SL) 0.125 MG SL tablet, PLACE 1 TABLET UNDER THE TONGUE AND ALLOW TO DISSOLVE AS NEEDED EVERY 4 HRS IF NEEDED, Disp: , Rfl: 0   Insulin  Pen Needle 32G X 4 MM MISC, 1 each by Does not apply route once a week., Disp: 10 each, Rfl: 0   lisdexamfetamine  (VYVANSE ) 60 MG capsule, Take 60 mg by mouth every morning., Disp: , Rfl:    MOUNJARO  2.5 MG/0.5ML Pen, Inject 2.5 mg into the skin once a week., Disp: , Rfl:    Multiple Vitamin (MULTIVITAMIN) capsule, Take 1 capsule by mouth  daily., Disp: , Rfl:    nitrofurantoin (MACRODANTIN) 50 MG capsule, Take 50 mg by mouth as needed (fir uti prevention)., Disp: , Rfl:    propranolol  ER (INDERAL  LA) 80 MG 24 hr capsule, Take 1 capsule (80 mg total) by mouth daily., Disp: 90 capsule, Rfl: 3   rosuvastatin  (CRESTOR ) 20 MG tablet, TAKE 1 TABLET BY MOUTH EVERY  EVENING, Disp: 15 tablet, Rfl: 0   spironolactone  (ALDACTONE ) 50 MG tablet, TAKE 1 TABLET BY MOUTH DAILY, Disp: 15 tablet, Rfl: 0   valACYclovir (VALTREX) 500 MG tablet, Use as directed, Disp: , Rfl:    Vitamin D , Ergocalciferol , (DRISDOL ) 1.25 MG (50000 UNIT) CAPS capsule, Take 1 capsule (50,000 Units total) by mouth every 7 (seven) days., Disp: 12 capsule, Rfl: 0

## 2024-06-22 MED ORDER — CLONAZEPAM 0.5 MG PO TABS: 0.5000 mg | ORAL_TABLET | Freq: Every day | ORAL | 0 refills | Status: AC

## 2024-06-24 ENCOUNTER — Encounter: Payer: Self-pay | Admitting: Family Medicine

## 2024-06-24 MED ORDER — TIRZEPATIDE 5 MG/0.5ML ~~LOC~~ SOAJ: 5.0000 mg | SUBCUTANEOUS | 1 refills | Status: AC

## 2024-06-24 MED ORDER — LISDEXAMFETAMINE DIMESYLATE 60 MG PO CAPS: 60.0000 mg | ORAL_CAPSULE | Freq: Every morning | ORAL | 0 refills | Status: DC

## 2024-06-25 ENCOUNTER — Other Ambulatory Visit: Payer: Self-pay | Admitting: Physician Assistant

## 2024-07-03 ENCOUNTER — Other Ambulatory Visit: Payer: Self-pay | Admitting: Family Medicine

## 2024-07-03 DIAGNOSIS — F900 Attention-deficit hyperactivity disorder, predominantly inattentive type: Secondary | ICD-10-CM

## 2024-07-03 MED ORDER — LISDEXAMFETAMINE DIMESYLATE 60 MG PO CAPS: 60.0000 mg | ORAL_CAPSULE | Freq: Every morning | ORAL | 0 refills | Status: DC

## 2024-07-03 NOTE — Telephone Encounter (Signed)
 I called and spoke with patient and the Rx that was sent to [facility] was too expensive and she would ike to have it sent to [facility] pharmacy.

## 2024-07-03 NOTE — Telephone Encounter (Signed)
 Requesting: lisdexamfetamine  (VYVANSE ) 60 MG capsule  Last Visit: 06/21/2024 Next Visit: 07/26/2024 Last Refill: 06/24/2024  Please Advise

## 2024-07-03 NOTE — Telephone Encounter (Signed)
 Copied from CRM [id number]. Topic: Clinical - Medication Refill >> Jul 03, 2024 10:46 AM Shereese L wrote: Medication: lisdexamfetamine  (VYVANSE ) 60 MG capsule  Has the patient contacted their pharmacy? Yes (Agent: If no, request that the patient contact the pharmacy for the refill. If patient does not wish to contact the pharmacy document the reason why and proceed with request.) (Agent: If yes, when and what did the pharmacy advise?)  This is the patient's preferred pharmacy:  [id number] - [place], [place] - [address] AT [facility] [address] [phone] [address] [place] [address]   Is this the correct pharmacy for this prescription? Yes If no, delete pharmacy and type the correct one.   Has the prescription been filled recently? Yes  Is the patient out of the medication? Yes  Has the patient been seen for an appointment in the last year OR does the patient have an upcoming appointment? Yes  Can we respond through MyChart? Yes  Agent: Please be advised that Rx refills may take up to 3 business days. We ask that you follow-up with your pharmacy.

## 2024-07-10 ENCOUNTER — Ambulatory Visit: Admitting: Family Medicine

## 2024-07-17 ENCOUNTER — Ambulatory Visit: Admitting: Physician Assistant

## 2024-07-26 ENCOUNTER — Ambulatory Visit: Admitting: Family Medicine

## 2024-07-31 ENCOUNTER — Ambulatory Visit: Admitting: Physician Assistant

## 2024-08-03 ENCOUNTER — Other Ambulatory Visit: Payer: Self-pay

## 2024-08-03 DIAGNOSIS — F900 Attention-deficit hyperactivity disorder, predominantly inattentive type: Secondary | ICD-10-CM

## 2024-08-03 NOTE — Telephone Encounter (Signed)
 Refill request received for Vyvanse  60mg  FOV: 08/30/24 LOV:06/21/24 Last refill: 07/03/24 Medication is pending your approval.

## 2024-08-03 NOTE — Telephone Encounter (Signed)
 Copied from CRM #[id number]. Topic: Clinical - Medication Refill >> Aug 03, 2024  9:51 AM Larissa S wrote: Medication: lisdexamfetamine  (VYVANSE ) 60 MG capsule  Has the patient contacted their pharmacy? Yes (Agent: If no, request that the patient contact the pharmacy for the refill. If patient does not wish to contact the pharmacy document the reason why and proceed with request.) (Agent: If yes, when and what did the pharmacy advise?)  This is the patient's preferred pharmacy:   [id number] - [place], [place] - [address] [place] [address] Phone: [phone] Fax: [phone]  Is this the correct pharmacy for this prescription? Yes If no, delete pharmacy and type the correct one.   Has the prescription been filled recently? No  Is the patient out of the medication? Yes  Has the patient been seen for an appointment in the last year OR does the patient have an upcoming appointment? Yes  Can we respond through MyChart? Yes  Agent: Please be advised that Rx refills may take up to 3 business days. We ask that you follow-up with your pharmacy.

## 2024-08-03 NOTE — Telephone Encounter (Signed)
" °*  STAT* If patient is at the pharmacy, call can be transferred to refill team.   1. Which medications need to be refilled? (please list name of each medication and dose if known)   rosuvastatin  (CRESTOR ) 20 MG tablet   2. Would you like to learn more about the convenience, safety, & potential cost savings by using the [facility]?   3. Are you open to using the [facility] (Type [facility]. ).  4. Which pharmacy/location (including street and city if local pharmacy) is medication to be sent to?  [id number] - [place], [place] - [address] AT [address]   5. Do they need a 30 day or 90 day supply?   Patient stated she has been out of this medication for a couple of weeks.  Patient has appointment scheduled with Dr. Floretta on 3/4. "

## 2024-08-04 MED ORDER — LISDEXAMFETAMINE DIMESYLATE 60 MG PO CAPS: 60.0000 mg | ORAL_CAPSULE | Freq: Every morning | ORAL | 0 refills | Status: AC

## 2024-08-06 MED ORDER — ROSUVASTATIN CALCIUM 20 MG PO TABS: 20.0000 mg | ORAL_TABLET | Freq: Every evening | ORAL | 0 refills | Status: AC

## 2024-08-06 NOTE — Addendum Note (Signed)
 Addended by: MEMORY DELON POUR on: 08/06/2024 08:14 AM   Modules accepted: Orders

## 2024-08-06 NOTE — Telephone Encounter (Signed)
 Pt scheduled to see Dr. Floretta 09/12/24, refill sent to get pt to her appointment.

## 2024-08-28 ENCOUNTER — Ambulatory Visit: Admitting: Family Medicine

## 2024-08-30 ENCOUNTER — Ambulatory Visit: Admitting: Family Medicine

## 2024-09-12 ENCOUNTER — Ambulatory Visit: Admitting: Student in an Organized Health Care Education/Training Program

## 2024-10-31 ENCOUNTER — Ambulatory Visit (HOSPITAL_BASED_OUTPATIENT_CLINIC_OR_DEPARTMENT_OTHER): Admitting: Cardiovascular Disease
